# Patient Record
Sex: Male | Born: 1975 | Race: White | Hispanic: No | Marital: Married | Smoking: Never smoker
Health system: Southern US, Community
[De-identification: ages and names within clinical notes are randomized; demographics above are authoritative.]

## PROBLEM LIST (undated history)

## (undated) DIAGNOSIS — S76119A Strain of unspecified quadriceps muscle, fascia and tendon, initial encounter: Secondary | ICD-10-CM

## (undated) DIAGNOSIS — N4400 Torsion of testis, unspecified: Secondary | ICD-10-CM

## (undated) DIAGNOSIS — R7989 Other specified abnormal findings of blood chemistry: Secondary | ICD-10-CM

## (undated) DIAGNOSIS — G43909 Migraine, unspecified, not intractable, without status migrainosus: Secondary | ICD-10-CM

## (undated) DIAGNOSIS — D569 Thalassemia, unspecified: Secondary | ICD-10-CM

## (undated) HISTORY — DX: Migraine, unspecified, not intractable, without status migrainosus: G43.909

## (undated) HISTORY — DX: Strain of unspecified quadriceps muscle, fascia and tendon, initial encounter: S76.119A

## (undated) HISTORY — DX: Thalassemia, unspecified: D56.9

## (undated) HISTORY — DX: Other specified abnormal findings of blood chemistry: R79.89

## (undated) HISTORY — PX: OTHER SURGICAL HISTORY: SHX169

## (undated) HISTORY — DX: Torsion of testis, unspecified: N44.00

## (undated) HISTORY — PX: ORCHIECTOMY: SHX2116

---

## 2003-06-08 ENCOUNTER — Encounter: Admission: RE | Admit: 2003-06-08 | Discharge: 2003-06-08 | Payer: Self-pay | Admitting: Internal Medicine

## 2003-08-17 ENCOUNTER — Encounter: Payer: Self-pay | Admitting: Internal Medicine

## 2004-04-05 ENCOUNTER — Ambulatory Visit: Payer: Self-pay | Admitting: Internal Medicine

## 2004-04-19 ENCOUNTER — Ambulatory Visit: Payer: Self-pay | Admitting: Internal Medicine

## 2004-07-14 ENCOUNTER — Ambulatory Visit: Payer: Self-pay | Admitting: Internal Medicine

## 2004-07-21 ENCOUNTER — Ambulatory Visit: Payer: Self-pay | Admitting: Internal Medicine

## 2004-07-28 ENCOUNTER — Ambulatory Visit: Payer: Self-pay | Admitting: Internal Medicine

## 2005-02-17 ENCOUNTER — Ambulatory Visit: Payer: Self-pay | Admitting: Internal Medicine

## 2005-05-10 ENCOUNTER — Ambulatory Visit: Payer: Self-pay | Admitting: Internal Medicine

## 2005-05-16 ENCOUNTER — Ambulatory Visit: Payer: Self-pay | Admitting: Internal Medicine

## 2006-10-29 ENCOUNTER — Ambulatory Visit: Payer: Self-pay | Admitting: Internal Medicine

## 2006-10-29 LAB — CONVERTED CEMR LAB
Alkaline Phosphatase: 59 units/L (ref 39–117)
BUN: 16 mg/dL (ref 6–23)
Basophils Relative: 0.2 % (ref 0.0–1.0)
CO2: 31 meq/L (ref 19–32)
Creatinine, Ser: 1.1 mg/dL (ref 0.4–1.5)
Eosinophils Relative: 1.7 % (ref 0.0–5.0)
HCT: 38.2 % — ABNORMAL LOW (ref 39.0–52.0)
Hemoglobin: 12.2 g/dL — ABNORMAL LOW (ref 13.0–17.0)
LDL Cholesterol: 86 mg/dL (ref 0–99)
Monocytes Absolute: 0.4 10*3/uL (ref 0.2–0.7)
Monocytes Relative: 7.6 % (ref 3.0–11.0)
Neutrophils Relative %: 65.1 % (ref 43.0–77.0)
Potassium: 4 meq/L (ref 3.5–5.1)
RDW: 15.2 % — ABNORMAL HIGH (ref 11.5–14.6)
Total Bilirubin: 1.5 mg/dL — ABNORMAL HIGH (ref 0.3–1.2)
Total Protein: 6.6 g/dL (ref 6.0–8.3)
VLDL: 14 mg/dL (ref 0–40)
WBC: 4.8 10*3/uL (ref 4.5–10.5)

## 2006-11-05 ENCOUNTER — Ambulatory Visit: Payer: Self-pay | Admitting: Internal Medicine

## 2006-11-05 DIAGNOSIS — D568 Other thalassemias: Secondary | ICD-10-CM | POA: Insufficient documentation

## 2006-11-05 LAB — CONVERTED CEMR LAB: Iron: 105 ug/dL (ref 42–165)

## 2007-04-01 ENCOUNTER — Ambulatory Visit: Payer: Self-pay | Admitting: Internal Medicine

## 2007-04-01 LAB — CONVERTED CEMR LAB
Basophils Absolute: 0 10*3/uL (ref 0.0–0.1)
Eosinophils Absolute: 0.1 10*3/uL (ref 0.0–0.6)
Hemoglobin: 12.8 g/dL — ABNORMAL LOW (ref 13.0–17.0)
Iron: 113 ug/dL (ref 42–165)
Lymphocytes Relative: 21.5 % (ref 12.0–46.0)
MCHC: 32.5 g/dL (ref 30.0–36.0)
Monocytes Absolute: 0.4 10*3/uL (ref 0.2–0.7)
Monocytes Relative: 7.1 % (ref 3.0–11.0)
Neutro Abs: 4.3 10*3/uL (ref 1.4–7.7)
RDW: 15.1 % — ABNORMAL HIGH (ref 11.5–14.6)

## 2007-09-23 ENCOUNTER — Encounter: Payer: Self-pay | Admitting: Internal Medicine

## 2007-09-23 ENCOUNTER — Ambulatory Visit: Payer: Self-pay | Admitting: Internal Medicine

## 2007-09-23 DIAGNOSIS — C44599 Other specified malignant neoplasm of skin of other part of trunk: Secondary | ICD-10-CM | POA: Insufficient documentation

## 2007-09-23 DIAGNOSIS — L568 Other specified acute skin changes due to ultraviolet radiation: Secondary | ICD-10-CM | POA: Insufficient documentation

## 2007-10-10 ENCOUNTER — Ambulatory Visit: Payer: Self-pay | Admitting: Internal Medicine

## 2007-10-10 ENCOUNTER — Encounter: Payer: Self-pay | Admitting: Internal Medicine

## 2007-10-16 ENCOUNTER — Encounter: Payer: Self-pay | Admitting: Internal Medicine

## 2007-10-22 ENCOUNTER — Encounter: Payer: Self-pay | Admitting: Internal Medicine

## 2007-12-09 ENCOUNTER — Ambulatory Visit: Payer: Self-pay | Admitting: Internal Medicine

## 2007-12-09 LAB — CONVERTED CEMR LAB
ALT: 30 units/L (ref 0–53)
Albumin: 4.1 g/dL (ref 3.5–5.2)
Alkaline Phosphatase: 47 units/L (ref 39–117)
BUN: 15 mg/dL (ref 6–23)
CO2: 32 meq/L (ref 19–32)
Eosinophils Relative: 2.4 % (ref 0.0–5.0)
GFR calc Af Amer: 110 mL/min
Glucose, Bld: 90 mg/dL (ref 70–99)
HCT: 37.3 % — ABNORMAL LOW (ref 39.0–52.0)
Hemoglobin: 12 g/dL — ABNORMAL LOW (ref 13.0–17.0)
Lymphocytes Relative: 22.9 % (ref 12.0–46.0)
Monocytes Absolute: 0.3 10*3/uL (ref 0.1–1.0)
Monocytes Relative: 6.5 % (ref 3.0–12.0)
Neutro Abs: 3.6 10*3/uL (ref 1.4–7.7)
Nitrite: NEGATIVE
Platelets: 165 10*3/uL (ref 150–400)
Potassium: 3.9 meq/L (ref 3.5–5.1)
Specific Gravity, Urine: 1.015
Total CHOL/HDL Ratio: 6
Total Protein: 6.4 g/dL (ref 6.0–8.3)
WBC Urine, dipstick: NEGATIVE
WBC: 5.2 10*3/uL (ref 4.5–10.5)

## 2007-12-16 ENCOUNTER — Ambulatory Visit: Payer: Self-pay | Admitting: Internal Medicine

## 2007-12-16 DIAGNOSIS — E782 Mixed hyperlipidemia: Secondary | ICD-10-CM | POA: Insufficient documentation

## 2007-12-20 ENCOUNTER — Ambulatory Visit: Payer: Self-pay | Admitting: Internal Medicine

## 2007-12-25 LAB — CONVERTED CEMR LAB: Mumps IgG: 1.25 — ABNORMAL HIGH

## 2008-07-22 ENCOUNTER — Telehealth: Payer: Self-pay | Admitting: Internal Medicine

## 2009-04-28 ENCOUNTER — Ambulatory Visit: Payer: Self-pay | Admitting: Internal Medicine

## 2009-04-28 DIAGNOSIS — R1084 Generalized abdominal pain: Secondary | ICD-10-CM | POA: Insufficient documentation

## 2009-04-28 LAB — CONVERTED CEMR LAB
ALT: 35 units/L (ref 0–53)
AST: 25 units/L (ref 0–37)
Albumin: 4.3 g/dL (ref 3.5–5.2)
Alkaline Phosphatase: 69 units/L (ref 39–117)
Basophils Absolute: 0 10*3/uL (ref 0.0–0.1)
Basophils Relative: 0 % (ref 0–1)
Hemoglobin: 13.2 g/dL (ref 13.0–17.0)
Lymphocytes Relative: 11 % — ABNORMAL LOW (ref 12–46)
MCHC: 31.7 g/dL (ref 30.0–36.0)
Neutro Abs: 6.4 10*3/uL (ref 1.7–7.7)
Neutrophils Relative %: 82 % — ABNORMAL HIGH (ref 43–77)
Platelets: 231 10*3/uL (ref 150–400)
RDW: 15.6 % — ABNORMAL HIGH (ref 11.5–15.5)
Total Protein: 6.9 g/dL (ref 6.0–8.3)

## 2009-04-30 ENCOUNTER — Telehealth: Payer: Self-pay | Admitting: Internal Medicine

## 2009-07-05 ENCOUNTER — Telehealth (INDEPENDENT_AMBULATORY_CARE_PROVIDER_SITE_OTHER): Payer: Self-pay | Admitting: *Deleted

## 2010-05-17 NOTE — Progress Notes (Signed)
Summary: needs to speak with Jose Lutz  Phone Note Call from Patient Call back at Home Phone [phone]   Caller: Patient-live call Reason for Call: Acute Illness Summary of Call: Was told to call back and speak with Jose Lutz. Needs to give her an update. He is fine. please return call. Initial call taken by: Warnell Forester,  April 30, 2009 12:57 PM  Follow-up for Phone Call        left message on machine call back if problems Follow-up by: Willy Eddy, LPN,  April 30, 2009 1:40 PM

## 2010-05-17 NOTE — Assessment & Plan Note (Signed)
Summary: sore throat/bmw   Vital Signs:  Patient profile:   35 year old male Height:      73 inches Weight:      180 pounds BMI:     23.83 Temp:     98.2 degrees F oral Pulse rate:   72 / minute Resp:     14 per minute BP sitting:   110 / 70  (left arm)  Vitals Entered By: Willy Eddy, LPN (April 28, 2009 12:19 PM) CC: had sinus infection about 1 and 1/2 weeks ago and took avelox for 3-4 days and started having explosvie diarrhea and n&v for 24 hours-stopped avelox and abd quietend down then started back in several days with abd pain and diarrhea   CC:  had sinus infection about 1 and 1/2 weeks ago and took avelox for 3-4 days and started having explosvie diarrhea and n&v for 24 hours-stopped avelox and abd quietend down then started back in several days with abd pain and diarrhea.  History of Present Illness: Took avelox for 3 days then had diarrhea with vomiting... ( 04/19/2009) after 24 hours he has week but the symptoms resolved. Then four days ago the loose stools and abdominal pain resumed ( has bee taking imodium) stools are watery brown in color  Pain is mid epigastric pt has between 3-4 stools a day has not been on other ABx that the avelox no recent dental work no family hx of colitis no hx of ulcer  Preventive Screening-Counseling & Management  Alcohol-Tobacco     Alcohol drinks/day: <1     Smoking Status: never     Passive Smoke Exposure: no  Allergies: No Known Drug Allergies  Past History:  Family History: Last updated: 10/10/2006 Family History of Arthritis Family Hsitory Headaches Family History Other cancer-Prostate  Social History: Last updated: 12/16/2007 Married Never Smoked Alcohol use-yes Occupation: Airline pilot Regular exercise-yes  Risk Factors: Alcohol Use: <1 (04/28/2009) Caffeine Use: 0 (11/05/2006) Exercise: yes (12/16/2007)  Risk Factors: Smoking Status: never (04/28/2009) Passive Smoke Exposure: no (04/28/2009)  Past  medical, surgical, family and social histories (including risk factors) reviewed, and no changes noted (except as noted below).  Past Medical History: Reviewed history from 12/16/2007 and no changes required. Migraines hereditary thalasemia trait tore quad tendn in June 2009  Past Surgical History: Reviewed history from 11/05/2006 and no changes required. testicular torsion Orchiectomy  Family History: Reviewed history from 10/10/2006 and no changes required. Family History of Arthritis Family Hsitory Headaches Family History Other cancer-Prostate  Social History: Reviewed history from 12/16/2007 and no changes required. Married Never Smoked Alcohol use-yes Occupation: Airline pilot Regular exercise-yes  Review of Systems  The patient denies anorexia, fever, weight loss, weight gain, vision loss, decreased hearing, hoarseness, chest pain, syncope, dyspnea on exertion, peripheral edema, prolonged cough, headaches, hemoptysis, abdominal pain, melena, hematochezia, severe indigestion/heartburn, hematuria, incontinence, genital sores, muscle weakness, suspicious skin lesions, transient blindness, difficulty walking, depression, unusual weight change, abnormal bleeding, enlarged lymph nodes, angioedema, and breast masses.    Physical Exam  General:  alert and pale.   Head:  normocephalic and atraumatic.   Eyes:  pupils equal and pupils round.   Ears:  R ear normal and L ear normal.   Nose:  no external deformity and no nasal discharge.   Neck:  No deformities, masses, or tenderness noted. Lungs:  Normal respiratory effort, chest expands symmetrically. Lungs are clear to auscultation, no crackles or wheezes. Heart:  Normal rate and regular rhythm. S1 and S2  normal without gallop, murmur, click, rub or other extra sounds. Abdomen:  no guarding, no rigidity, no rebound tenderness, epigastric tenderness, LUQ tenderness, and LLQ tenderness.     Impression & Recommendations:  Problem # 1:   ABDOMINAL PAIN, GENERALIZED (ICD-789.07) Assessment New  differential included  gal bladder, colitis ( infections and inflamatory ) and less likely but still of concern cdif  Discussed symptom control with the patient.  WILL CHECK cbc AND LFTS. ORDER CIPRO AND FLAGYL WITH CLOSE MONITERING AND CONSDERATON FOR COLONOSCOPY IF SYMPTOMS PERSIST  Orders: Venipuncture (09811) TLB-CBC Platelet - w/Differential (85025-CBCD) TLB-Hepatic/Liver Function Pnl (80076-HEPATIC)  Complete Medication List: 1)  Clarinex 5 Mg Tabs (Desloratadine) .... As needed 2)  Excedrin Migraine 250-250-65 Mg Tabs (Aspirin-acetaminophen-caffeine) .... Take prn 3)  Cipro 500 Mg Tabs (Ciprofloxacin hcl) .... One by mouth two times a day 4)  Flagyl 500 Mg Tabs (Metronidazole) .... One by mouth tid  Patient Instructions: 1)  Take your antibiotic as prescribed until ALL of it is gone, but stop if you develop a rash or swelling and contact our office as soon as possible.  cALL FRIDAY FOR STATIS UPDATE Prescriptions: FLAGYL 500 MG TABS (METRONIDAZOLE) ONE by mouth tid  #30 x 0   Entered and Authorized by:   Stacie Glaze MD   Signed by:   Stacie Glaze MD on 04/28/2009   Method used:   Print then Give to Patient   RxID:   [id number] CIPRO 500 MG TABS (CIPROFLOXACIN HCL) one by mouth two times a day  #20 x 0   Entered and Authorized by:   Stacie Glaze MD   Signed by:   Stacie Glaze MD on 04/28/2009   Method used:   Print then Give to Patient   RxID:   [id number]

## 2010-05-17 NOTE — Progress Notes (Signed)
Summary: REQ FOR REFILL  Phone Note Call from Patient   Caller: Patient Summary of Call: Pt called to req a Rx for med: Clarinex 5 Mg Tabs sent to [facility] in [place].  Initial call taken by: Debbra Riding,  July 05, 2009 11:01 AM    Prescriptions: CLARINEX 5 MG  TABS (DESLORATADINE) as needed  #30 x 6   Entered by:   Willy Eddy, LPN   Authorized by:   Stacie Glaze MD   Signed by:   Willy Eddy, LPN on 54/27/0623   Method used:   Electronically to        [facility] [address] [phone]* (retail)       [address]       [place], [place]  [address]       Ph: [phone]       Fax: [phone]   RxID:   [id number]

## 2010-11-28 ENCOUNTER — Ambulatory Visit (INDEPENDENT_AMBULATORY_CARE_PROVIDER_SITE_OTHER): Payer: BC Managed Care – PPO | Admitting: Internal Medicine

## 2010-11-28 ENCOUNTER — Encounter: Payer: Self-pay | Admitting: Internal Medicine

## 2010-11-28 VITALS — BP 110/70 | HR 68 | Temp 98.2°F | Resp 14 | Ht 73.0 in | Wt 178.0 lb

## 2010-11-28 DIAGNOSIS — Z23 Encounter for immunization: Secondary | ICD-10-CM

## 2010-11-28 DIAGNOSIS — D563 Thalassemia minor: Secondary | ICD-10-CM

## 2010-11-28 DIAGNOSIS — Z0000 Encounter for general adult medical examination without abnormal findings: Secondary | ICD-10-CM

## 2010-11-28 NOTE — Progress Notes (Signed)
  Subjective:    Patient ID: Jose Lutz, male    DOB: 1975/11/16, 35 y.o.   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  Has a history of thalassemia doing well blood pressure vital signs are all stable exercise regular  Review of Systems  Constitutional: Negative for fever and fatigue.  HENT: Negative for hearing loss, congestion, neck pain and postnasal drip.   Eyes: Negative for discharge, redness and visual disturbance.  Respiratory: Negative for cough, shortness of breath and wheezing.   Cardiovascular: Negative for leg swelling.  Gastrointestinal: Negative for abdominal pain, constipation and abdominal distention.  Genitourinary: Negative for urgency and frequency.  Musculoskeletal: Negative for joint swelling and arthralgias.  Skin: Negative for color change and rash.  Neurological: Negative for weakness and light-headedness.  Hematological: Negative for adenopathy.  Psychiatric/Behavioral: Negative for behavioral problems.   Past Medical History  Diagnosis Date  . Migraines   . Quadriceps tendon rupture    Past Surgical History  Procedure Date  . Testicular torsion   . Orchiectomy     reports that he has never smoked. He does not have any smokeless tobacco history on file. He reports that he does not drink alcohol or use illicit drugs. family history includes Arthritis in his other; Cancer in his other; and Migraines in his other. Allergies not on file     Objective:   Physical Exam  Nursing note and vitals reviewed. Constitutional: He appears well-developed and well-nourished.  HENT:  Head: Normocephalic and atraumatic.  Eyes: Conjunctivae are normal. Pupils are equal, round, and reactive to light.  Neck: Normal range of motion. Neck supple.    Cardiovascular: Normal rate and regular rhythm.   Pulmonary/Chest: Effort normal and breath sounds normal.  Abdominal: Soft. Bowel sounds are normal.          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

## 2010-11-29 LAB — VARICELLA ZOSTER ANTIBODY, IGG: Varicella IgG: 3.69 {ISR} — ABNORMAL HIGH

## 2010-11-29 LAB — RUBELLA SCREEN: Rubella: 49.9 IU/mL — ABNORMAL HIGH

## 2010-12-29 ENCOUNTER — Ambulatory Visit: Payer: BC Managed Care – PPO | Admitting: Internal Medicine

## 2010-12-30 ENCOUNTER — Ambulatory Visit (INDEPENDENT_AMBULATORY_CARE_PROVIDER_SITE_OTHER): Payer: BC Managed Care – PPO | Admitting: *Deleted

## 2010-12-30 DIAGNOSIS — Z23 Encounter for immunization: Secondary | ICD-10-CM

## 2011-06-02 ENCOUNTER — Ambulatory Visit: Payer: BC Managed Care – PPO | Admitting: Internal Medicine

## 2011-06-07 ENCOUNTER — Ambulatory Visit (INDEPENDENT_AMBULATORY_CARE_PROVIDER_SITE_OTHER): Payer: BC Managed Care – PPO | Admitting: Internal Medicine

## 2011-06-07 DIAGNOSIS — Z0000 Encounter for general adult medical examination without abnormal findings: Secondary | ICD-10-CM

## 2011-06-07 DIAGNOSIS — Z23 Encounter for immunization: Secondary | ICD-10-CM

## 2011-08-22 ENCOUNTER — Telehealth: Payer: Self-pay | Admitting: *Deleted

## 2011-08-22 NOTE — Telephone Encounter (Signed)
Pt wants Bonnye to call him re: his son's diagnosis, please.

## 2011-08-23 NOTE — Telephone Encounter (Signed)
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

## 2011-11-03 ENCOUNTER — Telehealth: Payer: Self-pay | Admitting: Internal Medicine

## 2011-11-03 ENCOUNTER — Ambulatory Visit (INDEPENDENT_AMBULATORY_CARE_PROVIDER_SITE_OTHER): Payer: BC Managed Care – PPO | Admitting: Internal Medicine

## 2011-11-03 ENCOUNTER — Encounter: Payer: Self-pay | Admitting: Internal Medicine

## 2011-11-03 VITALS — BP 100/70 | HR 72 | Temp 98.6°F | Resp 16 | Ht 73.0 in | Wt 184.0 lb

## 2011-11-03 DIAGNOSIS — S2232XA Fracture of one rib, left side, initial encounter for closed fracture: Secondary | ICD-10-CM

## 2011-11-03 DIAGNOSIS — S2239XA Fracture of one rib, unspecified side, initial encounter for closed fracture: Secondary | ICD-10-CM

## 2011-11-03 MED ORDER — HYDROCODONE-ACETAMINOPHEN 10-325 MG PO TABS: 1.0000 | ORAL_TABLET | Freq: Three times a day (TID) | ORAL | Status: AC | PRN

## 2011-11-03 NOTE — Progress Notes (Signed)
  Subjective:    Patient ID: Jose Lutz, male    DOB: 1975/07/07, 36 y.o.   MRN: [id number]  HPI Patient had trauma and a sports accident with a need to the chest resulting in a large hematoma over the seventh rib and examination consistent with a seventh rib fracture.  The sixth and eighth ribs appear to be stable he has no lung findings indicating any puncture or hemoperitoneum   Review of Systems  Constitutional: Negative for fever and fatigue.  HENT: Negative for hearing loss, congestion, neck pain and postnasal drip.   Eyes: Negative for discharge, redness and visual disturbance.  Respiratory: Negative for cough, shortness of breath and wheezing.   Cardiovascular: Negative for leg swelling.  Gastrointestinal: Negative for abdominal pain, constipation and abdominal distention.  Genitourinary: Negative for urgency and frequency.  Musculoskeletal: Negative for joint swelling and arthralgias.  Skin: Negative for color change and rash.  Neurological: Negative for weakness and light-headedness.  Hematological: Negative for adenopathy.  Psychiatric/Behavioral: Negative for behavioral problems.       Objective:   Physical Exam  Constitutional: He appears well-developed and well-nourished.  HENT:  Head: Normocephalic and atraumatic.  Eyes: Conjunctivae are normal. Pupils are equal, round, and reactive to light.  Neck: Normal range of motion. Neck supple.  Cardiovascular: Normal rate and regular rhythm.   Pulmonary/Chest: Effort normal and breath sounds normal. No respiratory distress. He has no wheezes. He has no rales. He exhibits tenderness.  Abdominal: Soft. Bowel sounds are normal.          Assessment & Plan:  Rib fracture pain control teach how to do a rib wrap

## 2011-11-03 NOTE — Patient Instructions (Signed)
Probable cracked ribRib Fracture Your caregiver has diagnosed you as having a rib fracture (a break). This can occur by a blow to the chest, by a fall against a hard object, or by violent coughing or sneezing. There may be one or many breaks. Rib fractures may heal on their own within 3 to 8 weeks. The longer healing period is usually associated with a continued cough or other aggravating activities. HOME CARE INSTRUCTIONS   Avoid strenuous activity. Be careful during activities and avoid bumping the injured rib. Activities that cause pain pull on the fracture site(s) and are best avoided if possible.   Eat a normal, well-balanced diet. Drink plenty of fluids to avoid constipation.   Take deep breaths several times a day to keep lungs free of infection. Try to cough several times a day, splinting the injured area with a pillow. This will help prevent pneumonia.   Do not wear a rib belt or binder. These restrict breathing which can lead to pneumonia.   Only take over-the-counter or prescription medicines for pain, discomfort, or fever as directed by your caregiver.  SEEK MEDICAL CARE IF:  You develop a continual cough, associated with thick or bloody sputum. SEEK IMMEDIATE MEDICAL CARE IF:   You have a fever.   You have difficulty breathing.   You have nausea (feeling sick to your stomach), vomiting, or abdominal (belly) pain.   You have worsening pain, not controlled with medications.  Document Released: 04/03/2005 Document Revised: 03/23/2011 Document Reviewed: 09/05/2006 [facility] Patient Information 2012 [place], [place].

## 2011-11-03 NOTE — Telephone Encounter (Signed)
Caller: /Patient; PCP: Darryll Capers; CB#: [phone];  Call regarding Injury/Trauma playing [employer] on 11/02/11; Another player came down on his L upper side with their knee- under arm pit area. He has bruise, area tender and hurts intensely when he tries to lift L arm out in front. Hurts with deep breath, not with regular breathing. Trouble sleeping last night; took Ibuprofen- 2 tabs and helped some. Triage and care advice per Chest Trauma Protocol and advised to be checked within 4 hours. He is out of town at [employer] but will go to [facility] this evening and f/u with the office if advised.

## 2011-11-03 NOTE — Telephone Encounter (Signed)
Ov this pm if he can get from [place] to here by 4- will call if cant make it and go to urgent care

## 2011-11-06 ENCOUNTER — Other Ambulatory Visit: Payer: Self-pay | Admitting: Internal Medicine

## 2011-11-06 ENCOUNTER — Ambulatory Visit (INDEPENDENT_AMBULATORY_CARE_PROVIDER_SITE_OTHER)
Admission: RE | Admit: 2011-11-06 | Discharge: 2011-11-06 | Disposition: A | Discharge status: Home / Self Care | Payer: BC Managed Care – PPO | Source: Ambulatory Visit | Attending: Internal Medicine | Admitting: Internal Medicine

## 2011-11-06 DIAGNOSIS — S2341XA Sprain of ribs, initial encounter: Secondary | ICD-10-CM

## 2011-11-06 DIAGNOSIS — S299XXA Unspecified injury of thorax, initial encounter: Secondary | ICD-10-CM

## 2011-11-14 ENCOUNTER — Telehealth: Payer: Self-pay | Admitting: Internal Medicine

## 2011-11-14 NOTE — Telephone Encounter (Signed)
He can have labs in august and just add to the edit notes about getting other labs( see note on existing lab appointment

## 2011-11-14 NOTE — Telephone Encounter (Signed)
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 Please advise. Patient can be contacted at [phone].

## 2011-11-16 ENCOUNTER — Other Ambulatory Visit (INDEPENDENT_AMBULATORY_CARE_PROVIDER_SITE_OTHER): Payer: BC Managed Care – PPO

## 2011-11-16 DIAGNOSIS — Z0000 Encounter for general adult medical examination without abnormal findings: Secondary | ICD-10-CM

## 2011-11-16 LAB — LIPID PANEL
HDL: 38.5 mg/dL — ABNORMAL LOW (ref >39.00)
Triglycerides: 94 mg/dL (ref 0.0–149.0)
VLDL: 18.8 mg/dL (ref 0.0–40.0)

## 2011-11-16 LAB — POCT URINALYSIS DIPSTICK
Bilirubin, UA: NEGATIVE
Blood, UA: NEGATIVE
Ketones, UA: NEGATIVE
Protein, UA: NEGATIVE
Spec Grav, UA: 1.025
pH, UA: 6

## 2011-11-16 LAB — CBC WITH DIFFERENTIAL/PLATELET
Basophils Absolute: 0 10*3/uL (ref 0.0–0.1)
Eosinophils Absolute: 0.2 10*3/uL (ref 0.0–0.7)
Hemoglobin: 11.9 g/dL — ABNORMAL LOW (ref 13.0–17.0)
Lymphocytes Relative: 24.6 % (ref 12.0–46.0)
Lymphs Abs: 1.1 10*3/uL (ref 0.7–4.0)
MCHC: 31.1 g/dL (ref 30.0–36.0)
Monocytes Relative: 6.2 % (ref 3.0–12.0)
Neutro Abs: 2.9 10*3/uL (ref 1.4–7.7)
Platelets: 161 10*3/uL (ref 150.0–400.0)
RDW: 16.9 % — ABNORMAL HIGH (ref 11.5–14.6)

## 2011-11-16 LAB — BASIC METABOLIC PANEL
BUN: 19 mg/dL (ref 6–23)
CO2: 27 mEq/L (ref 19–32)
Calcium: 9.2 mg/dL (ref 8.4–10.5)
GFR: 88.66 mL/min (ref >60.00)
Glucose, Bld: 87 mg/dL (ref 70–99)

## 2011-11-16 LAB — HEPATIC FUNCTION PANEL
Albumin: 4.3 g/dL (ref 3.5–5.2)
Alkaline Phosphatase: 102 U/L (ref 39–117)
Total Protein: 6.7 g/dL (ref 6.0–8.3)

## 2011-11-17 LAB — HEPATITIS B SURFACE ANTIBODY, QUANTITATIVE: Hepatitis B-Post: 1000 m[IU]/mL

## 2011-11-17 LAB — MUMPS ANTIBODY, IGG: Mumps IgG: 1.89 {ISR} — ABNORMAL HIGH

## 2011-11-21 ENCOUNTER — Encounter: Payer: Self-pay | Admitting: Internal Medicine

## 2011-11-22 ENCOUNTER — Telehealth: Payer: Self-pay | Admitting: *Deleted

## 2011-11-23 NOTE — Telephone Encounter (Signed)
done

## 2011-11-24 ENCOUNTER — Other Ambulatory Visit: Payer: Self-pay | Admitting: *Deleted

## 2011-11-24 MED ORDER — HYDROCODONE-ACETAMINOPHEN 10-325 MG PO TABS: 1.0000 | ORAL_TABLET | Freq: Three times a day (TID) | ORAL | Status: AC | PRN

## 2011-12-08 ENCOUNTER — Ambulatory Visit (INDEPENDENT_AMBULATORY_CARE_PROVIDER_SITE_OTHER): Payer: BC Managed Care – PPO | Admitting: Internal Medicine

## 2011-12-08 DIAGNOSIS — Z0000 Encounter for general adult medical examination without abnormal findings: Secondary | ICD-10-CM

## 2011-12-12 ENCOUNTER — Other Ambulatory Visit: Payer: Self-pay | Admitting: Internal Medicine

## 2011-12-12 LAB — TB SKIN TEST
Induration: 0 mm
TB Skin Test: NEGATIVE

## 2012-01-19 ENCOUNTER — Other Ambulatory Visit: Payer: BC Managed Care – PPO

## 2012-01-26 ENCOUNTER — Encounter: Payer: BC Managed Care – PPO | Admitting: Internal Medicine

## 2012-11-20 ENCOUNTER — Telehealth: Payer: Self-pay | Admitting: Internal Medicine

## 2012-11-20 NOTE — Telephone Encounter (Signed)
Pt is requesting to see Adline Mango for his CPX as he is required to have it completed by the end of this month. Please assist.

## 2012-11-20 NOTE — Telephone Encounter (Signed)
Ok with me 

## 2012-11-22 ENCOUNTER — Ambulatory Visit: Payer: BC Managed Care – PPO | Admitting: Internal Medicine

## 2012-11-28 ENCOUNTER — Other Ambulatory Visit (INDEPENDENT_AMBULATORY_CARE_PROVIDER_SITE_OTHER): Payer: BC Managed Care – PPO

## 2012-11-28 DIAGNOSIS — Z0000 Encounter for general adult medical examination without abnormal findings: Secondary | ICD-10-CM

## 2012-11-28 LAB — BASIC METABOLIC PANEL
Calcium: 9.1 mg/dL (ref 8.4–10.5)
GFR: 86.19 mL/min (ref >60.00)
Glucose, Bld: 87 mg/dL (ref 70–99)
Potassium: 3.6 mEq/L (ref 3.5–5.1)
Sodium: 137 mEq/L (ref 135–145)

## 2012-11-28 LAB — POCT URINALYSIS DIPSTICK
Bilirubin, UA: NEGATIVE
Glucose, UA: NEGATIVE
Leukocytes, UA: NEGATIVE
Nitrite, UA: NEGATIVE

## 2012-11-28 LAB — CBC WITH DIFFERENTIAL/PLATELET
Basophils Absolute: 0 10*3/uL (ref 0.0–0.1)
Eosinophils Relative: 2.3 % (ref 0.0–5.0)
Hemoglobin: 12.6 g/dL — ABNORMAL LOW (ref 13.0–17.0)
Lymphocytes Relative: 22.6 % (ref 12.0–46.0)
Monocytes Relative: 5.7 % (ref 3.0–12.0)
Neutro Abs: 3.7 10*3/uL (ref 1.4–7.7)
RBC: 6.45 Mil/uL — ABNORMAL HIGH (ref 4.22–5.81)
RDW: 16.7 % — ABNORMAL HIGH (ref 11.5–14.6)
WBC: 5.4 10*3/uL (ref 4.5–10.5)

## 2012-11-28 LAB — HEPATIC FUNCTION PANEL
AST: 21 U/L (ref 0–37)
Bilirubin, Direct: 0.1 mg/dL (ref 0.0–0.3)
Total Bilirubin: 1 mg/dL (ref 0.3–1.2)

## 2012-11-28 LAB — TSH: TSH: 2.81 u[IU]/mL (ref 0.35–5.50)

## 2012-11-28 LAB — LIPID PANEL
HDL: 33.7 mg/dL — ABNORMAL LOW (ref >39.00)
Triglycerides: 89 mg/dL (ref 0.0–149.0)

## 2012-11-29 LAB — VARICELLA ZOSTER ANTIBODY, IGG: Varicella IgG: 713.3 Index — ABNORMAL HIGH (ref <135.00)

## 2012-11-29 LAB — VARICELLA ZOSTER ANTIBODY, IGM: Varicella Zoster Ab IgM: 0.09 {ISR} (ref <0.91)

## 2012-11-29 LAB — HEPATITIS B SURFACE ANTIBODY,QUALITATIVE: Hep B S Ab: POSITIVE — AB

## 2012-12-03 ENCOUNTER — Encounter: Payer: Self-pay | Admitting: Family

## 2012-12-03 ENCOUNTER — Ambulatory Visit (INDEPENDENT_AMBULATORY_CARE_PROVIDER_SITE_OTHER): Payer: BC Managed Care – PPO | Admitting: Family

## 2012-12-03 VITALS — BP 108/64 | HR 91 | Ht 72.0 in | Wt 190.0 lb

## 2012-12-03 DIAGNOSIS — Z23 Encounter for immunization: Secondary | ICD-10-CM

## 2012-12-03 DIAGNOSIS — Z111 Encounter for screening for respiratory tuberculosis: Secondary | ICD-10-CM

## 2012-12-03 DIAGNOSIS — Z0000 Encounter for general adult medical examination without abnormal findings: Secondary | ICD-10-CM

## 2012-12-03 DIAGNOSIS — E7800 Pure hypercholesterolemia, unspecified: Secondary | ICD-10-CM

## 2012-12-03 NOTE — Progress Notes (Signed)
Subjective:    Patient ID: Jose Lutz, male    DOB: 1976-04-02, 37 y.o.   MRN: [id number]  HPI   37 year old white male, nonsmoker, patient of Dr. Lovell Sheehan presents for complete physical exam. Denies any concerns today. He has a history of thalassemia, hyperlipidemia better but stable.  Review of Systems  Constitutional: Negative.   HENT: Negative.   Eyes: Negative.   Respiratory: Negative.   Cardiovascular: Negative.   Gastrointestinal: Negative.   Endocrine: Negative.   Genitourinary: Negative.   Musculoskeletal: Negative.   Skin: Negative.   Allergic/Immunologic: Negative.   Neurological: Negative.   Hematological: Negative.   Psychiatric/Behavioral: Negative.    Past Medical History  Diagnosis Date  . Migraines   . Quadriceps tendon rupture     History   Social History  . Marital Status: Married    Spouse Name: N/A    Number of Children: N/A  . Years of Education: N/A   Occupational History  . Not on file.   Social History Main Topics  . Smoking status: Never Smoker   . Smokeless tobacco: Not on file  . Alcohol Use: No  . Drug Use: No  . Sexual Activity: Not on file   Other Topics Concern  . Not on file   Social History Narrative   Married   Occupation: Sales   Regular exercise:  Yes     Past Surgical History  Procedure Laterality Date  . Testicular torsion    . Orchiectomy      Family History  Problem Relation Age of Onset  . Arthritis Other   . Migraines Other   . Cancer Other     prostate     No Known Allergies  Current Outpatient Prescriptions on File Prior to Visit  Medication Sig Dispense Refill  . aspirin-acetaminophen-caffeine (EXCEDRIN MIGRAINE) 250-250-65 MG per tablet Take 1 tablet by mouth every 6 (six) hours as needed.        . desloratadine (CLARINEX) 5 MG tablet Take 5 mg by mouth daily.        Marland Kitchen ibuprofen (ADVIL,MOTRIN) 200 MG tablet Take 200 mg by mouth every 6 (six) hours as needed.       No current  facility-administered medications on file prior to visit.    BP 108/64  Pulse 91  Ht 6' (1.829 m)  Wt 190 lb (86.183 kg)  BMI 25.76 kg/m2chart    Objective:   Physical Exam  Constitutional: He is oriented to person, place, and time. He appears well-developed and well-nourished.  HENT:  Right Ear: External ear normal.  Left Ear: External ear normal.  Nose: Nose normal.  Mouth/Throat: Oropharynx is clear and moist.  Eyes: Conjunctivae and EOM are normal. Pupils are equal, round, and reactive to light.  Neck: Normal range of motion. Neck supple. No thyromegaly present.  Cardiovascular: Normal rate, regular rhythm and normal heart sounds.   Pulmonary/Chest: Effort normal and breath sounds normal.  Abdominal: Soft. Bowel sounds are normal. He exhibits no distension. There is no tenderness. There is no rebound.  Genitourinary: Penis normal.  1 testicle but normal  Musculoskeletal: Normal range of motion. He exhibits no edema and no tenderness.  Neurological: He is alert and oriented to person, place, and time. He has normal reflexes. He displays normal reflexes. No cranial nerve deficit. Coordination normal.  Skin: Skin is warm and dry. No rash noted. No erythema.  Psychiatric: He has a normal mood and affect.      Tdap IM x  1    Assessment & Plan:   Assessment: 1. Complete physical exam 2. Hypercholesterolemia  Plan: Encourage healthy diet and exercise. Followup in one year and sooner as needed.

## 2012-12-03 NOTE — Patient Instructions (Addendum)
Exercise to Stay Healthy Exercise helps you become and stay healthy. EXERCISE IDEAS AND TIPS Choose exercises that:  You enjoy.  Fit into your day. You do not need to exercise really hard to be healthy. You can do exercises at a slow or medium level and stay healthy. You can:  Stretch before and after working out.  Try yoga, Pilates, or tai chi.  Lift weights.  Walk fast, swim, jog, run, climb stairs, bicycle, dance, or rollerskate.  Take aerobic classes. Exercises that burn about 150 calories:  Running 1  miles in 15 minutes.  Playing volleyball for 45 to 60 minutes.  Washing and waxing a car for 45 to 60 minutes.  Playing touch football for 45 minutes.  Walking 1  miles in 35 minutes.  Pushing a stroller 1  miles in 30 minutes.  Playing basketball for 30 minutes.  Raking leaves for 30 minutes.  Bicycling 5 miles in 30 minutes.  Walking 2 miles in 30 minutes.  Dancing for 30 minutes.  Shoveling snow for 15 minutes.  Swimming laps for 20 minutes.  Walking up stairs for 15 minutes.  Bicycling 4 miles in 15 minutes.  Gardening for 30 to 45 minutes.  Jumping rope for 15 minutes.  Washing windows or floors for 45 to 60 minutes. Document Released: 05/06/2010 Document Revised: 06/26/2011 Document Reviewed: 05/06/2010 [employer] Patient Information 2014 [employer].  

## 2012-12-06 LAB — TB SKIN TEST
Induration: 0 mm
TB Skin Test: NEGATIVE

## 2013-02-15 ENCOUNTER — Encounter: Payer: Self-pay | Admitting: Emergency Medicine

## 2013-02-15 ENCOUNTER — Emergency Department
Admission: EM | Admit: 2013-02-15 | Discharge: 2013-02-15 | Disposition: A | Discharge status: Home / Self Care | Payer: BC Managed Care – PPO | Source: Home / Self Care | Attending: Family Medicine | Admitting: Family Medicine

## 2013-02-15 DIAGNOSIS — L03119 Cellulitis of unspecified part of limb: Secondary | ICD-10-CM

## 2013-02-15 DIAGNOSIS — IMO0002 Reserved for concepts with insufficient information to code with codable children: Secondary | ICD-10-CM

## 2013-02-15 LAB — POCT CBC W AUTO DIFF (K'VILLE URGENT CARE)

## 2013-02-15 MED ORDER — DOXYCYCLINE HYCLATE 100 MG PO CAPS: 100.0000 mg | ORAL_CAPSULE | Freq: Two times a day (BID) | ORAL | Status: DC

## 2013-02-15 NOTE — ED Provider Notes (Signed)
CSN: [id number]     Arrival date & time 02/15/13  1702 History   First MD Initiated Contact with Patient 02/15/13 1722     Chief Complaint  Patient presents with  . Joint Swelling    right elbow      HPI Comments: Patient states that he pulled a small hair from the posterior aspect of his right elbow about 5 days ago.  Since then the skin of his elbow has gradually become tender to palpation, and his elbow feels tight with movement although not restricted in motion.  Today he noted slight redness around his elbow and warmth.  He has had chills but no fever.  He recalls no trauma to the elbow.   The history is provided by the patient.    Past Medical History  Diagnosis Date  . Migraines   . Quadriceps tendon rupture    Past Surgical History  Procedure Laterality Date  . Testicular torsion    . Orchiectomy     Family History  Problem Relation Age of Onset  . Arthritis Other   . Migraines Other   . Cancer Other     prostate    History  Substance Use Topics  . Smoking status: Never Smoker   . Smokeless tobacco: Not on file  . Alcohol Use: No    Review of Systems  Constitutional: Positive for chills. Negative for fever, activity change and fatigue.  HENT: Negative.   Eyes: Negative.   Respiratory: Negative.   Cardiovascular: Negative.   Gastrointestinal: Negative.   Genitourinary: Negative.   Musculoskeletal: Positive for joint swelling. Negative for myalgias.  Skin: Positive for color change.  Neurological: Negative for headaches.    Allergies  Review of patient's allergies indicates no known allergies.  Home Medications   Current Outpatient Rx  Name  Route  Sig  Dispense  Refill  . aspirin-acetaminophen-caffeine (EXCEDRIN MIGRAINE) 250-250-65 MG per tablet   Oral   Take 1 tablet by mouth every 6 (six) hours as needed.           . desloratadine (CLARINEX) 5 MG tablet   Oral   Take 5 mg by mouth daily.           Marland Kitchen doxycycline (VIBRAMYCIN) 100 MG  capsule   Oral   Take 1 capsule (100 mg total) by mouth 2 (two) times daily.   20 capsule   0   . ibuprofen (ADVIL,MOTRIN) 200 MG tablet   Oral   Take 200 mg by mouth every 6 (six) hours as needed.          BP 124/85  Pulse 75  Temp(Src) 98 F (36.7 C) (Oral)  Resp 16  Ht 6\' 1"  (1.854 m)  Wt 191 lb (86.637 kg)  BMI 25.2 kg/m2  SpO2 98% Physical Exam Nursing notes and Vital Signs reviewed. Appearance:  Patient appears healthy, stated age, and in no acute distress Eyes:  Pupils are equal, round, and reactive to light and accomodation.  Extraocular movement is intact.  Conjunctivae are not inflamed  Lungs:  Clear to auscultation.  Breath sounds are equal.  Heart:  Regular rate and rhythm without murmurs, rubs, or gallops.   Extremities:  Right elbow is slightly warm to touch with faint erythema present.  The elbow has full range of motion.  There is tenderness over the olecranon bursa. Skin:  No rash present.   ED Course  Procedures  none    Labs Reviewed  POCT CBC W AUTO DIFF (  [facility]) WBC 8.9; LY 20.0; MO 6.0; GR 74.0; Hgb 11.9; Platelets 238          MDM   1. Cellulitis of elbow    Begin doxycycline to cover staph.  Ace wrap applied. Wear ace wrap to minimize swelling.  Apply ice pack 3 or 4 times daily.  Take Ibuprofen 200mg , 4 tabs every 8 hours with food.  Followup with Dr. Rodney Langton if not improving in 4 days, earlier if symptoms worsen.    Lattie Haw, MD 02/17/13 [phone]

## 2013-02-15 NOTE — ED Notes (Signed)
Patient c/o elbow being swollen, tender, sore and warm to touch x 5 days. Patient states it is tender and swelling is now forearm to biceps. Denies injury or trauma to elbow. Patient has tried OTC Excedrin as needed.

## 2013-02-17 ENCOUNTER — Telehealth: Payer: Self-pay | Admitting: Emergency Medicine

## 2013-07-07 ENCOUNTER — Other Ambulatory Visit: Payer: Self-pay | Admitting: Internal Medicine

## 2014-03-12 IMAGING — CR DG RIBS 2V*L*
4 series · 4 of 4 positions shown · non-contrast
Comparison: None.

CLINICAL DATA: History of injury.  History of pain in the rib area.

LEFT RIBS - 2 VIEW

[view not recorded (1 of 4)]
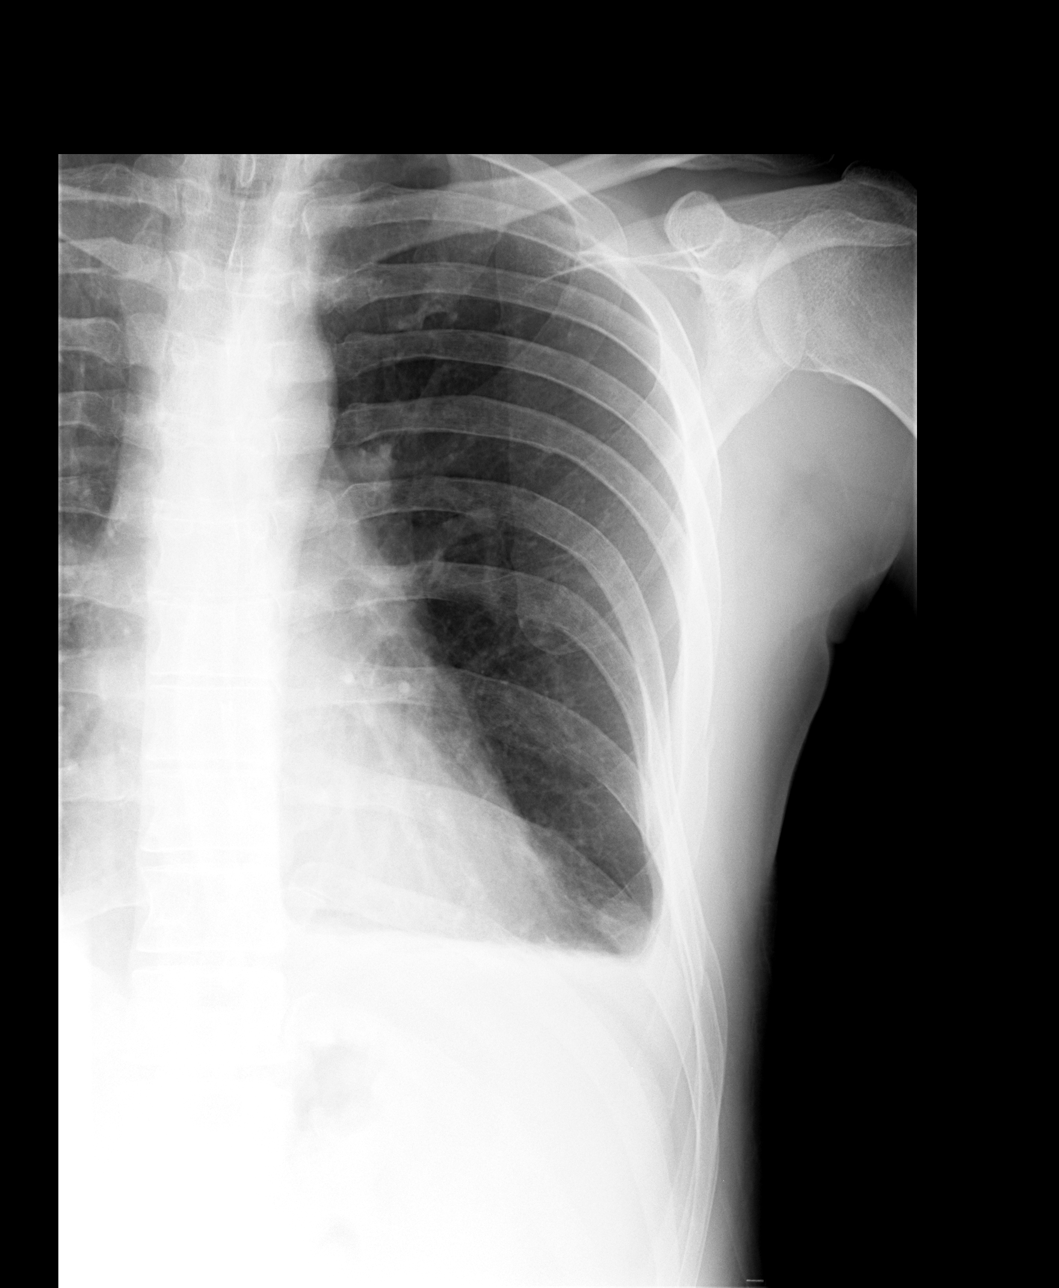

[view not recorded (2 of 4)]
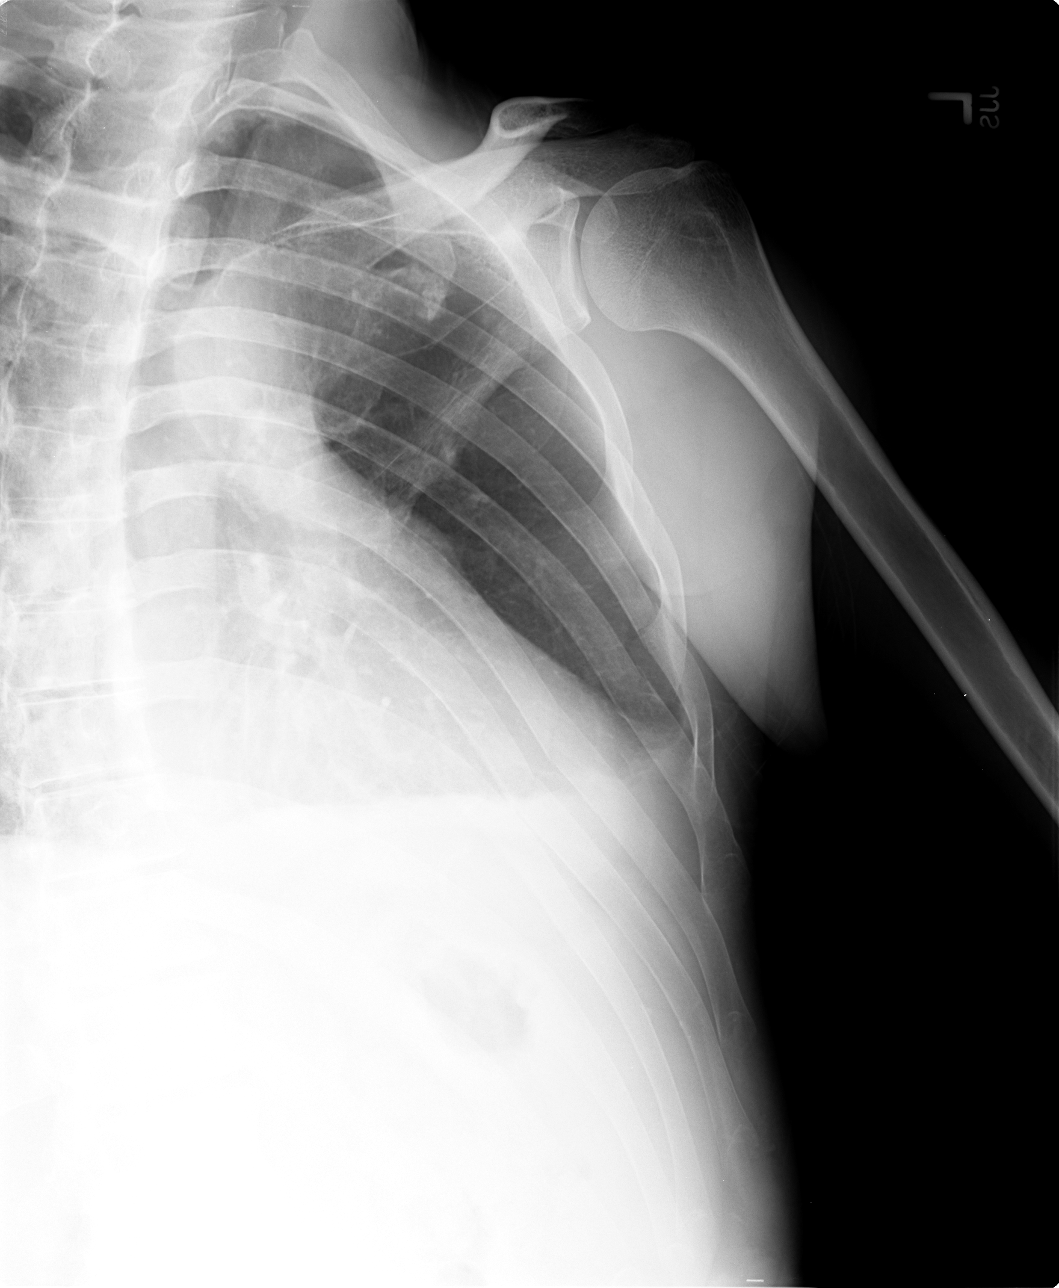

[view not recorded (3 of 4)]
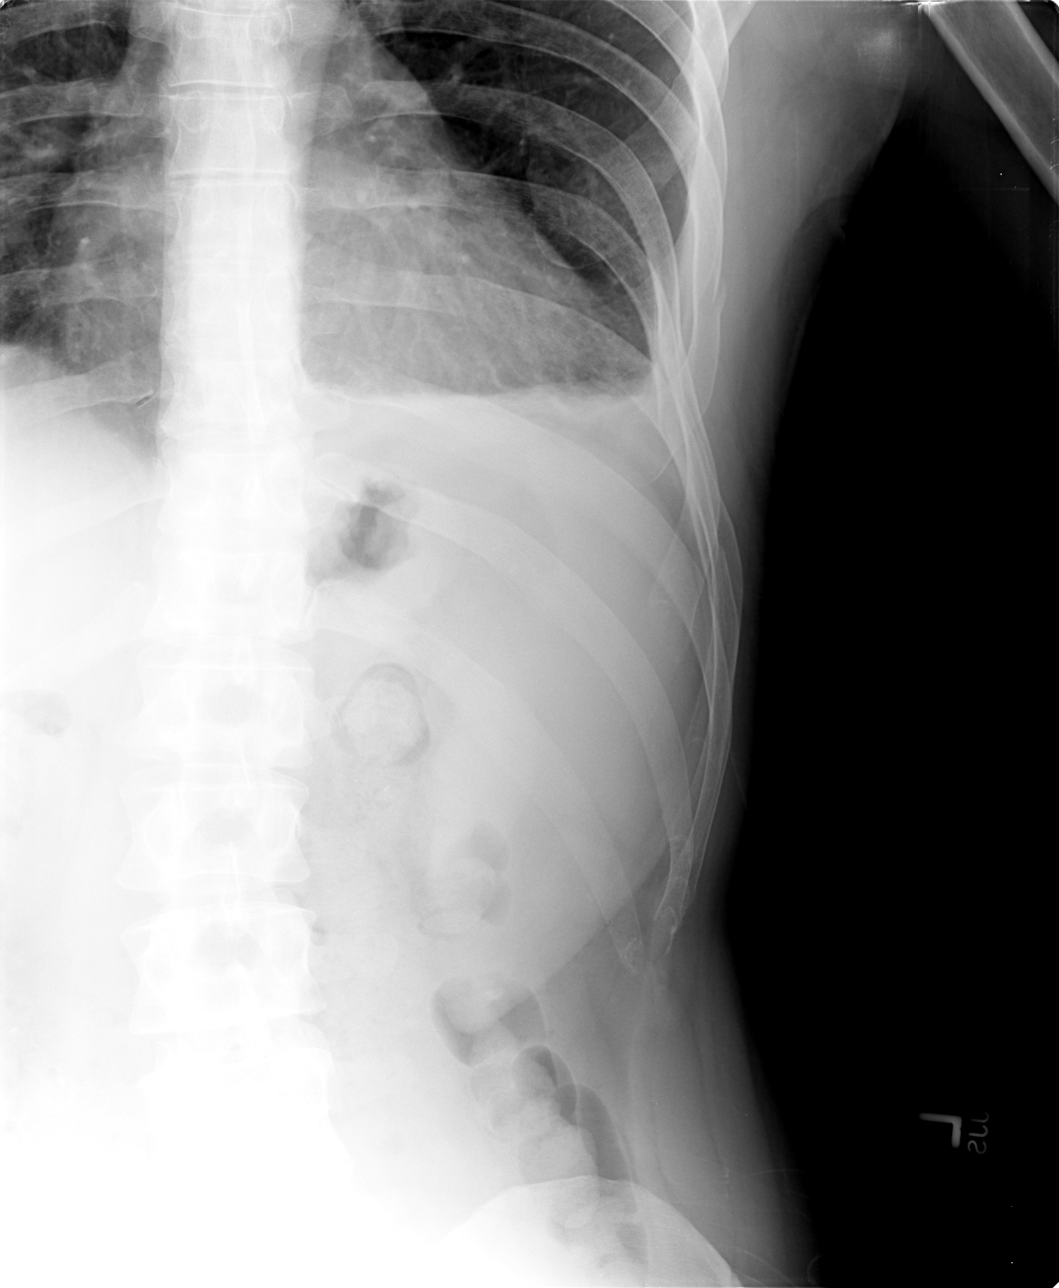

[view not recorded (4 of 4)]
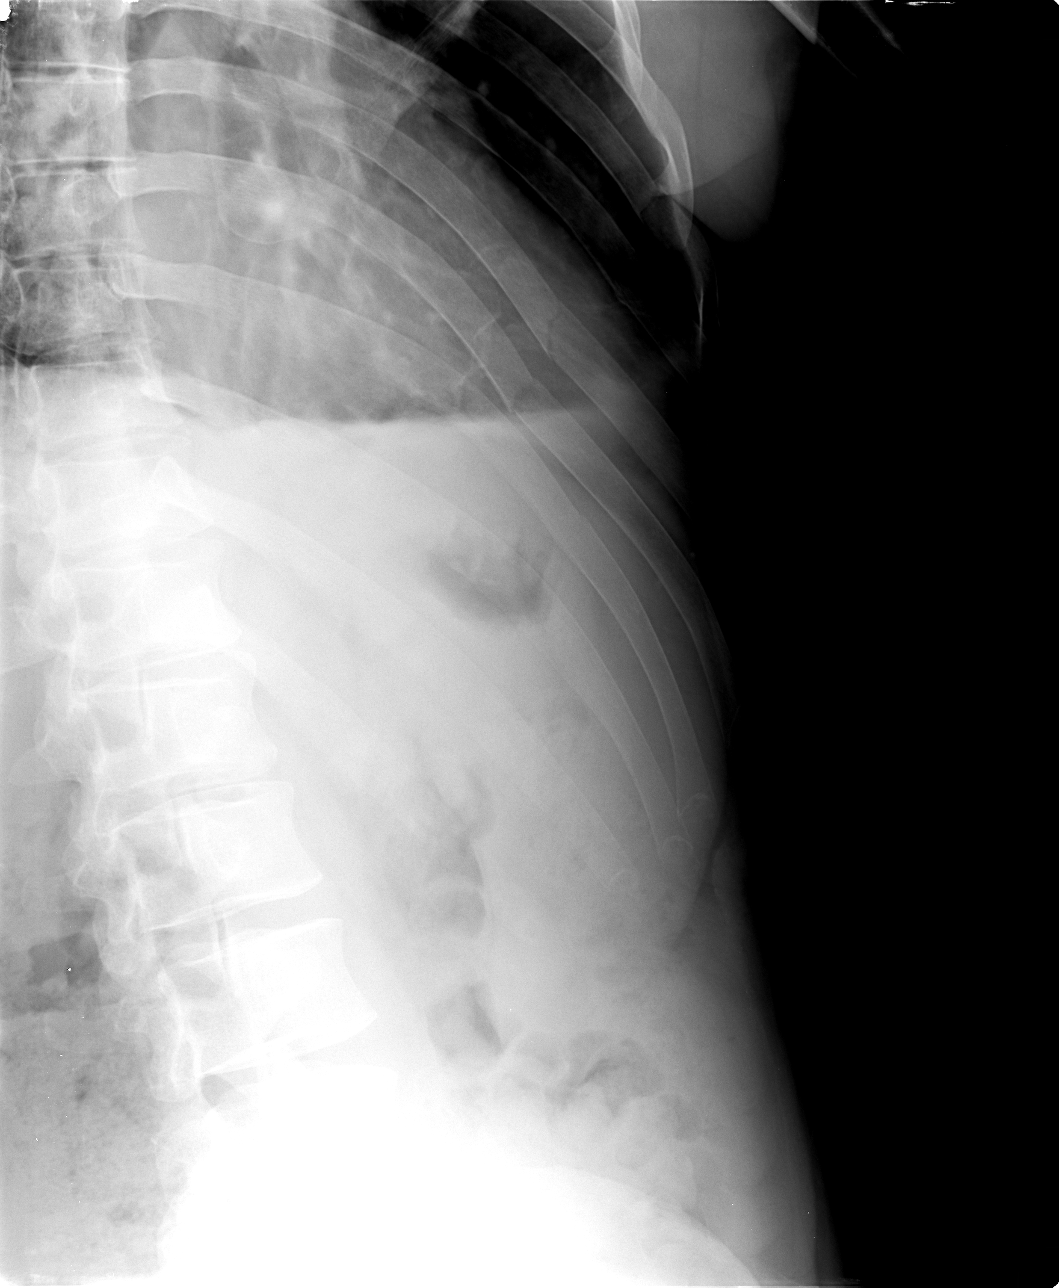

[4 of 4 positions shown; findings below may reference images not displayed]

FINDINGS: There are slightly displaced fractures  of the
anterolateral aspects of the left fifth, sixth, seventh, and eighth
ribs.  No pneumothorax is evident.  There is blunting of the left
costophrenic angle representing pleural thickening or small amount
of pleural fluid.  An element of pneumothorax cannot be excluded.
IMPRESSION: Fractures of left fifth, sixth, seventh, and eighth ribs. No
evidence of pneumothorax. Minimal left costophrenic angle blunting
is seen.  This may reflect pleural thickening versus a small amount
of fluid or blood in the costophrenic angle.

## 2015-02-18 ENCOUNTER — Encounter: Payer: Self-pay | Admitting: Family Medicine

## 2015-02-18 ENCOUNTER — Ambulatory Visit (INDEPENDENT_AMBULATORY_CARE_PROVIDER_SITE_OTHER): Payer: Managed Care, Other (non HMO) | Admitting: Family Medicine

## 2015-02-18 VITALS — BP 119/89 | HR 76 | Ht 73.0 in | Wt 184.0 lb

## 2015-02-18 DIAGNOSIS — E782 Mixed hyperlipidemia: Secondary | ICD-10-CM

## 2015-02-18 DIAGNOSIS — IMO0001 Reserved for inherently not codable concepts without codable children: Secondary | ICD-10-CM

## 2015-02-18 DIAGNOSIS — N529 Male erectile dysfunction, unspecified: Secondary | ICD-10-CM | POA: Insufficient documentation

## 2015-02-18 DIAGNOSIS — Z0000 Encounter for general adult medical examination without abnormal findings: Secondary | ICD-10-CM

## 2015-02-18 DIAGNOSIS — Z23 Encounter for immunization: Secondary | ICD-10-CM

## 2015-02-18 DIAGNOSIS — Z111 Encounter for screening for respiratory tuberculosis: Secondary | ICD-10-CM | POA: Diagnosis not present

## 2015-02-18 MED ORDER — SILDENAFIL CITRATE 20 MG PO TABS: 20.0000 mg | ORAL_TABLET | ORAL | Status: DC | PRN

## 2015-02-18 NOTE — Assessment & Plan Note (Addendum)
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 Encouraged communication and reassurance with his wife as well as will start on sildenafil 20mg  prn. Hormonal deficiency is also possible so will check testosterone.

## 2015-02-18 NOTE — Patient Instructions (Signed)
Thank you for coming in today. Get fasting labs.  Return as needed or in 1 year.  My staff will call you with results.

## 2015-02-18 NOTE — Progress Notes (Signed)
Jose Lutz is a 39 y.o. male who presents to [facility]  today for establishing care, health maintenance proof for work and erectile dysfunction.  Patient needs MMR and Hep B titers for work as he works in [employer]. His only health concern is an episode of inability to maintain erection around one week ago during intercourse with his wife. His wife responded poorly to this, causing stress to the patient. The next day he had an episode of inability to obtain erection. He followed this by avoiding intimate encounters twice since this time. He is able to achieve nocturnal erections per awakening with erection several times in the past week. He is also able to obtain and maintain erection through masturbation. He thinks this is likely due to stress caused by these episodes and his wife's negative response.   Aside from the above problem he has no other complaints and feels well.    Past Medical History  Diagnosis Date  . Migraines   . Quadriceps tendon rupture    Past Surgical History  Procedure Laterality Date  . Testicular torsion    . Orchiectomy     Social History  Substance Use Topics  . Smoking status: Never Smoker   . Smokeless tobacco: Not on file  . Alcohol Use: No   family history includes Arthritis in his other; Cancer in his other; Migraines in his other.  ROS as above Medications: Current Outpatient Prescriptions  Medication Sig Dispense Refill  . aspirin-acetaminophen-caffeine (EXCEDRIN MIGRAINE) 250-250-65 MG tablet Take by mouth every 6 (six) hours as needed for headache.    . Desloratadine (CLARINEX PO) Take by mouth.     No current facility-administered medications for this visit.   No Known Allergies   Exam:  BP 119/89 mmHg  Pulse 76  Ht 6' 1"  (1.854 m)  Wt 184 lb (83.462 kg)  BMI 24.28 kg/m2 Gen: Well NAD HEENT: EOMI,  MMM Lungs: Normal work of breathing. CTABL, no wheeze,  crackles.  Heart: RRR no MRG Abd: NABS, Soft. Nondistended, Nontender Exts: Brisk capillary refill, warm and well perfused.  Psych: Alert and oriented normal speech and thought processes.  No results found for this or any previous visit (from the past 24 hour(s)). No results found.   Please see individual assessment and plan sections.

## 2015-02-18 NOTE — Assessment & Plan Note (Addendum)
Patient is in very great health overall. Will check CBC, CMP, as well as place PPD to be read by urgent care Saturday morning and order MMR and Hep B titers for his work needs.   Obtain lipid panel

## 2015-02-18 NOTE — Assessment & Plan Note (Signed)
Stable. Will check lipid panel. 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 Patient is minimizing his risk factors as well through non-smoker status, balanced diet and frequent exercise. Consulted patient on adding 1-2 OTC omega-3 fatty acid supplementation daily to potentially further minimize his risk. ASCVD risk 1.6% based on old lab results, no further interventions or testing warranted at this time.

## 2015-02-19 LAB — LIPID PANEL
Cholesterol: 125 mg/dL (ref 125–200)
HDL: 38 mg/dL — AB (ref >=40)
LDL Cholesterol: 76 mg/dL (ref <130)
TRIGLYCERIDES: 57 mg/dL (ref <150)
Total CHOL/HDL Ratio: 3.3 Ratio (ref <=5.0)
VLDL: 11 mg/dL (ref <30)

## 2015-02-19 LAB — CBC
HCT: 35.9 % — ABNORMAL LOW (ref 39.0–52.0)
HEMOGLOBIN: 11.7 g/dL — AB (ref 13.0–17.0)
MCH: 19.5 pg — AB (ref 26.0–34.0)
MCHC: 32.6 g/dL (ref 30.0–36.0)
MCV: 59.9 fL — AB (ref 78.0–100.0)
Platelets: 208 10*3/uL (ref 150–400)
RBC: 5.99 MIL/uL — AB (ref 4.22–5.81)
RDW: 16.5 % — ABNORMAL HIGH (ref 11.5–15.5)
WBC: 5.3 10*3/uL (ref 4.0–10.5)

## 2015-02-19 LAB — COMPREHENSIVE METABOLIC PANEL
ALBUMIN: 4.1 g/dL (ref 3.6–5.1)
ALK PHOS: 53 U/L (ref 40–115)
ALT: 19 U/L (ref 9–46)
AST: 18 U/L (ref 10–40)
BILIRUBIN TOTAL: 0.9 mg/dL (ref 0.2–1.2)
BUN: 14 mg/dL (ref 7–25)
CALCIUM: 8.9 mg/dL (ref 8.6–10.3)
CO2: 29 mmol/L (ref 20–31)
Chloride: 104 mmol/L (ref 98–110)
Creat: 0.93 mg/dL (ref 0.60–1.35)
Glucose, Bld: 84 mg/dL (ref 65–99)
Potassium: 4 mmol/L (ref 3.5–5.3)
Sodium: 140 mmol/L (ref 135–146)
Total Protein: 6.3 g/dL (ref 6.1–8.1)

## 2015-02-19 LAB — TESTOSTERONE: Testosterone: 296 ng/dL — ABNORMAL LOW (ref 300–890)

## 2015-02-20 ENCOUNTER — Emergency Department
Admission: EM | Admit: 2015-02-20 | Discharge: 2015-02-20 | Disposition: A | Discharge status: Home / Self Care | Payer: Self-pay | Source: Home / Self Care

## 2015-02-20 LAB — HEPATITIS B SURFACE ANTIBODY, QUANTITATIVE: Hepatitis B-Post: 573 m[IU]/mL

## 2015-02-20 LAB — HIV ANTIBODY (ROUTINE TESTING W REFLEX): HIV: NONREACTIVE

## 2015-02-20 NOTE — ED Notes (Signed)
Pt was seen 11/3 for TB test placement.  He is here today to be read.  Result is Neg.

## 2015-02-22 ENCOUNTER — Other Ambulatory Visit: Payer: Self-pay | Admitting: Family Medicine

## 2015-02-22 DIAGNOSIS — R7989 Other specified abnormal findings of blood chemistry: Secondary | ICD-10-CM

## 2015-02-22 LAB — MEASLES/MUMPS/RUBELLA IMMUNITY
Mumps IgG: 72.3 AU/mL — ABNORMAL HIGH (ref <9.00)
Rubella: 2.38 Index — ABNORMAL HIGH (ref <0.90)
Rubeola IgG: 61.1 AU/mL — ABNORMAL HIGH (ref <25.00)

## 2015-02-22 NOTE — Progress Notes (Signed)
Quick Note:  1) labs for work are normal. 2) testosterone is a bit low. We should recheck this in one month. ______

## 2015-02-23 ENCOUNTER — Telehealth: Payer: Self-pay | Admitting: Family Medicine

## 2015-02-23 DIAGNOSIS — R7989 Other specified abnormal findings of blood chemistry: Secondary | ICD-10-CM

## 2015-02-23 LAB — VARICELLA ZOSTER IGG AND IGM
VARICELLA IGG: 708.1 {index} — AB (ref <135.00)
VARICELLA ZOSTER AB IGM: 0.05 {ISR} (ref <0.91)

## 2015-02-23 NOTE — Telephone Encounter (Signed)
Patient would like to have the labs printed that he will get drawn in about 1 month. Will also add LH and [facility]

## 2015-07-05 ENCOUNTER — Emergency Department
Admission: EM | Admit: 2015-07-05 | Discharge: 2015-07-05 | Disposition: A | Discharge status: Home / Self Care | Payer: Managed Care, Other (non HMO) | Source: Home / Self Care | Attending: Family Medicine | Admitting: Family Medicine

## 2015-07-05 ENCOUNTER — Encounter: Payer: Self-pay | Admitting: *Deleted

## 2015-07-05 DIAGNOSIS — J0100 Acute maxillary sinusitis, unspecified: Secondary | ICD-10-CM | POA: Diagnosis not present

## 2015-07-05 MED ORDER — PREDNISONE 20 MG PO TABS: ORAL_TABLET | ORAL | Status: DC

## 2015-07-05 MED ORDER — AMOXICILLIN-POT CLAVULANATE 875-125 MG PO TABS
1.0000 | ORAL_TABLET | Freq: Two times a day (BID) | ORAL | Status: DC
Start: 2015-07-05 — End: 2015-12-10

## 2015-07-05 NOTE — ED Provider Notes (Signed)
CSN: [id number]     Arrival date & time 07/05/15  1339 History   First MD Initiated Contact with Patient 07/05/15 1406     Chief Complaint  Patient presents with  . Facial Pain  . Sinus Problem   (Consider location/radiation/quality/duration/timing/severity/associated sxs/prior Treatment) HPI  The pt is a 40yo male presenting to [facility] with c/o 2.5 weeks of nasal congestion and mild intermittent productive cough that has turned into sinus pain and pressure with thick yellowed-green mucous the last 3 days.  He also c/o frontal headache, 3/10 at this time.  He has been taking ibuprofen and acetaminophen, which provided temporary relief. Denies fever, chills, n/v/d.     Past Medical History  Diagnosis Date  . Migraines   . Quadriceps tendon rupture    Past Surgical History  Procedure Laterality Date  . Testicular torsion    . Orchiectomy     Family History  Problem Relation Age of Onset  . Arthritis Other   . Migraines Other   . Cancer Other     prostate    Social History  Substance Use Topics  . Smoking status: Never Smoker   . Smokeless tobacco: None  . Alcohol Use: No    Review of Systems  Constitutional: Positive for fatigue. Negative for fever and chills.  HENT: Positive for congestion, rhinorrhea, sinus pressure and voice change ( hoarse voice). Negative for ear pain, sore throat and trouble swallowing.   Respiratory: Positive for cough. Negative for shortness of breath.   Cardiovascular: Negative for chest pain and palpitations.  Gastrointestinal: Negative for nausea, vomiting, abdominal pain and diarrhea.  Musculoskeletal: Positive for myalgias and arthralgias. Negative for back pain.  Skin: Negative for rash.  Neurological: Positive for headaches. Negative for dizziness and light-headedness.    Allergies  Review of patient's allergies indicates no known allergies.  Home Medications   Prior to Admission medications   Medication Sig Start Date End Date Taking?  Authorizing Provider  amoxicillin-clavulanate (AUGMENTIN) 875-125 MG tablet Take 1 tablet by mouth 2 (two) times daily. One po bid x 7 days 07/05/15   Noland Fordyce, PA-C  Desloratadine (CLARINEX PO) Take by mouth.    Historical Provider, MD  predniSONE (DELTASONE) 20 MG tablet 3 tabs po day one, then 2 po daily x 4 days 07/05/15   Noland Fordyce, PA-C  sildenafil (REVATIO) 20 MG tablet Take 1 tablet (20 mg total) by mouth as needed. 02/18/15   Gregor Hams, MD   Meds Ordered and Administered this Visit  Medications - No data to display  BP 113/74 mmHg  Pulse 82  Temp(Src) 98.1 F (36.7 C) (Oral)  Resp 14  Wt 191 lb (86.637 kg)  SpO2 97% No data found.   Physical Exam  Constitutional: He appears well-developed and well-nourished.  HENT:  Head: Normocephalic and atraumatic.  Right Ear: Tympanic membrane normal.  Left Ear: Tympanic membrane normal.  Nose: Mucosal edema and rhinorrhea present. Right sinus exhibits maxillary sinus tenderness. Left sinus exhibits maxillary sinus tenderness.  Mouth/Throat: Uvula is midline, oropharynx is clear and moist and mucous membranes are normal.  Eyes: Conjunctivae are normal. No scleral icterus.  Neck: Normal range of motion. Neck supple.  Cardiovascular: Normal rate, regular rhythm and normal heart sounds.   Pulmonary/Chest: Effort normal and breath sounds normal. No stridor. No respiratory distress. He has no wheezes. He has no rales. He exhibits no tenderness.  Abdominal: Soft. He exhibits no distension. There is no tenderness.  Musculoskeletal: Normal range of  motion.  Lymphadenopathy:    He has no cervical adenopathy.  Neurological: He is alert.  Skin: Skin is warm and dry.  Nursing note and vitals reviewed.   ED Course  Procedures (including critical care time)  Labs Review Labs Reviewed - No data to display  Imaging Review No results found.     MDM   1. Acute maxillary sinusitis, recurrence not specified    Pt c/o 3 days  of facial pain and pressure that started after 2.5 weeks of cold-like symptoms.  Sinus tenderness on exam.  Rx: Augmentin and prednisone.  Advised pt to use acetaminophen and ibuprofen as needed for fever and pain. Encouraged rest and fluids. F/u with PCP in 7-10 days if not improving, sooner if worsening. Pt verbalized understanding and agreement with tx plan.     Noland Fordyce, PA-C 07/05/15 1622

## 2015-07-05 NOTE — Discharge Instructions (Signed)
You may take 400-600mg  Ibuprofen (Motrin) every 6-8 hours for fever and pain  Alternate with Tylenol  You may take 500mg  Tylenol every 4-6 hours as needed for fever and pain  Follow-up with your primary care provider next week for recheck of symptoms if not improving.  Be sure to drink plenty of fluids and rest, at least 8hrs of sleep a night, preferably more while you are sick. Return urgent care or go to closest ER if you cannot keep down fluids/signs of dehydration, fever not reducing with Tylenol, difficulty breathing/wheezing, stiff neck, worsening condition, or other concerns (see below)  Please take antibiotics as prescribed and be sure to complete entire course even if you start to feel better to ensure infection does not come back.   Sinus Rinse WHAT IS A SINUS RINSE? A sinus rinse is a simple home treatment that is used to rinse your sinuses with a sterile mixture of salt and water (saline solution). Sinuses are air-filled spaces in your skull behind the bones of your face and forehead that open into your nasal cavity. You will use the following:  Saline solution.  Neti pot or spray bottle. This releases the saline solution into your nose and through your sinuses. Neti pots and spray bottles can be purchased at [employer], a health food store, or online. WHEN WOULD I DO A SINUS RINSE? A sinus rinse can help to clear mucus, dirt, dust, or pollen from the nasal cavity. You may do a sinus rinse when you have a cold, a virus, nasal allergy symptoms, a sinus infection, or stuffiness in the nose or sinuses. If you are considering a sinus rinse:  Ask your child's health care provider before performing a sinus rinse on your child.  Do not do a sinus rinse if you have had ear or nasal surgery, ear infection, or blocked ears. HOW DO I DO A SINUS RINSE?  Wash your hands.  Disinfect your device according to the directions provided and then dry it.  Use the solution that comes  with your device or one that is sold separately in stores. Follow the mixing directions on the package.  Fill your device with the amount of saline solution as directed by the device instructions.  Stand over a sink and tilt your head sideways over the sink.  Place the spout of the device in your upper nostril (the one closer to the ceiling).  Gently pour or squeeze the saline solution into the nasal cavity. The liquid should drain to the lower nostril if you are not overly congested.  Gently blow your nose. Blowing too hard may cause ear pain.  Repeat in the other nostril.  Clean and rinse your device with clean water and then air-dry it. ARE THERE RISKS OF A SINUS RINSE?  Sinus rinse is generally very safe and effective. However, there are a few risks, which include:   A burning sensation in the sinuses. This may happen if you do not make the saline solution as directed. Make sure to follow all directions when making the saline solution.  Infection from contaminated water. This is rare, but possible.  Nasal irritation.   This information is not intended to replace advice given to you by your health care provider. Make sure you discuss any questions you have with your health care provider.   Document Released: 10/29/2013 Document Reviewed: 10/29/2013 Elsevier Interactive Patient Education 2016 [employer].  Sinusitis, Adult Sinusitis is redness, soreness, and inflammation of the paranasal sinuses.  Paranasal sinuses are air pockets within the bones of your face. They are located beneath your eyes, in the middle of your forehead, and above your eyes. In healthy paranasal sinuses, mucus is able to drain out, and air is able to circulate through them by way of your nose. However, when your paranasal sinuses are inflamed, mucus and air can become trapped. This can allow bacteria and other germs to grow and cause infection. Sinusitis can develop quickly and last only a short time (acute)  or continue over a long period (chronic). Sinusitis that lasts for more than 12 weeks is considered chronic. CAUSES Causes of sinusitis include:  Allergies.  Structural abnormalities, such as displacement of the cartilage that separates your nostrils (deviated septum), which can decrease the air flow through your nose and sinuses and affect sinus drainage.  Functional abnormalities, such as when the small hairs (cilia) that line your sinuses and help remove mucus do not work properly or are not present. SIGNS AND SYMPTOMS Symptoms of acute and chronic sinusitis are the same. The primary symptoms are pain and pressure around the affected sinuses. Other symptoms include:  Upper toothache.  Earache.  Headache.  Bad breath.  Decreased sense of smell and taste.  A cough, which worsens when you are lying flat.  Fatigue.  Fever.  Thick drainage from your nose, which often is green and may contain pus (purulent).  Swelling and warmth over the affected sinuses. DIAGNOSIS Your health care provider will perform a physical exam. During your exam, your health care provider may perform any of the following to help determine if you have acute sinusitis or chronic sinusitis:  Look in your nose for signs of abnormal growths in your nostrils (nasal polyps).  Tap over the affected sinus to check for signs of infection.  View the inside of your sinuses using an imaging device that has a light attached (endoscope). If your health care provider suspects that you have chronic sinusitis, one or more of the following tests may be recommended:  Allergy tests.  Nasal culture. A sample of mucus is taken from your nose, sent to a lab, and screened for bacteria.  Nasal cytology. A sample of mucus is taken from your nose and examined by your health care provider to determine if your sinusitis is related to an allergy. TREATMENT Most cases of acute sinusitis are related to a viral infection and will  resolve on their own within 10 days. Sometimes, medicines are prescribed to help relieve symptoms of both acute and chronic sinusitis. These may include pain medicines, decongestants, nasal steroid sprays, or saline sprays. However, for sinusitis related to a bacterial infection, your health care provider will prescribe antibiotic medicines. These are medicines that will help kill the bacteria causing the infection. Rarely, sinusitis is caused by a fungal infection. In these cases, your health care provider will prescribe antifungal medicine. For some cases of chronic sinusitis, surgery is needed. Generally, these are cases in which sinusitis recurs more than 3 times per year, despite other treatments. HOME CARE INSTRUCTIONS  Drink plenty of water. Water helps thin the mucus so your sinuses can drain more easily.  Use a humidifier.  Inhale steam 3-4 times a day (for example, sit in the bathroom with the shower running).  Apply a warm, moist washcloth to your face 3-4 times a day, or as directed by your health care provider.  Use saline nasal sprays to help moisten and clean your sinuses.  Take medicines only as directed  by your health care provider.  If you were prescribed either an antibiotic or antifungal medicine, finish it all even if you start to feel better. SEEK IMMEDIATE MEDICAL CARE IF:  You have increasing pain or severe headaches.  You have nausea, vomiting, or drowsiness.  You have swelling around your face.  You have vision problems.  You have a stiff neck.  You have difficulty breathing.   This information is not intended to replace advice given to you by your health care provider. Make sure you discuss any questions you have with your health care provider.   Document Released: 04/03/2005 Document Revised: 04/24/2014 Document Reviewed: 04/18/2011 Elsevier Interactive Patient Education [employer].

## 2015-07-05 NOTE — ED Notes (Signed)
Pt c/o 2 1/2 weeks of cold symptoms that improved then turned into sinus pressure, HA and colored nasal discharge. Afebrile. Taken IBF and tylenol otc.

## 2015-12-10 ENCOUNTER — Ambulatory Visit (INDEPENDENT_AMBULATORY_CARE_PROVIDER_SITE_OTHER): Payer: Managed Care, Other (non HMO) | Admitting: Family Medicine

## 2015-12-10 VITALS — BP 109/75 | HR 64 | Wt 185.0 lb

## 2015-12-10 DIAGNOSIS — Z23 Encounter for immunization: Secondary | ICD-10-CM | POA: Diagnosis not present

## 2015-12-10 DIAGNOSIS — R7989 Other specified abnormal findings of blood chemistry: Secondary | ICD-10-CM

## 2015-12-10 DIAGNOSIS — Z111 Encounter for screening for respiratory tuberculosis: Secondary | ICD-10-CM | POA: Diagnosis not present

## 2015-12-10 DIAGNOSIS — E782 Mixed hyperlipidemia: Secondary | ICD-10-CM

## 2015-12-10 DIAGNOSIS — IMO0001 Reserved for inherently not codable concepts without codable children: Secondary | ICD-10-CM

## 2015-12-10 DIAGNOSIS — Z0000 Encounter for general adult medical examination without abnormal findings: Secondary | ICD-10-CM

## 2015-12-10 DIAGNOSIS — E291 Testicular hypofunction: Secondary | ICD-10-CM

## 2015-12-10 DIAGNOSIS — Z139 Encounter for screening, unspecified: Secondary | ICD-10-CM

## 2015-12-10 NOTE — Progress Notes (Signed)
       Jose Lutz is a 40 y.o. male who presents to [facility] today for well visit. Patient is doing well with no active medical problems. He was found to have low testosterone at the last check but was unable to get repeat labs to confirm this possible diagnosis. He notes that he continues to experience some mild erectile dysfunction that is well treated with sildenafil. He otherwise is doing great. He exercises and stretches to stay fit and healthy. He notes that he requires a flu vaccine and PPD for his job today.   Past Medical History:  Diagnosis Date  . Migraines   . Quadriceps tendon rupture    Past Surgical History:  Procedure Laterality Date  . ORCHIECTOMY    . testicular torsion     Social History  Substance Use Topics  . Smoking status: Never Smoker  . Smokeless tobacco: Not on file  . Alcohol use No   family history includes Arthritis in his other; Cancer in his other; Migraines in his other.  ROS as above: No headache, visual changes, nausea, vomiting, diarrhea, constipation, dizziness, abdominal pain, skin rash, fevers, chills, night sweats, weight loss, swollen lymph nodes, body aches, joint swelling, muscle aches, chest pain, shortness of breath, mood changes, visual or auditory hallucinations.    Medications: Current Outpatient Prescriptions  Medication Sig Dispense Refill  . Desloratadine (CLARINEX PO) Take by mouth.    . sildenafil (REVATIO) 20 MG tablet Take 1 tablet (20 mg total) by mouth as needed. 50 tablet 11   No current facility-administered medications for this visit.    No Known Allergies   Exam:  BP 109/75   Pulse 64   Wt 185 lb (83.9 kg)   BMI 24.41 kg/m  Gen: Well NAD HEENT: EOMI,  MMM Lungs: Normal work of breathing. CTABL Heart: RRR no MRG Abd: NABS, Soft. Nondistended, Nontender Exts: Brisk capillary refill,  warm and well perfused.   No results found for this or any previous visit (from the past 24 hour(s)). No results found.    Assessment and Plan: 40 y.o. male with Well adult. Doing great with minimal active medical problems. We'll obtain routine basic fasting labs as well as provide flu vaccine and place PPD.  Additionally will recheck testosterone LH FSH and PSA for possible low testosterone and possible testosterone supplementation in the near future.  Falls well return in one year or sooner if needed.   Orders Placed This Encounter  Procedures  . Flu Vaccine QUAD 36+ mos IM  . CBC  . Comprehensive metabolic panel    Order Specific Question:   Has the patient fasted?    Answer:   No  . Lipid panel    Order Specific Question:   Has the patient fasted?    Answer:   No  . Testosterone  . Follicle stimulating hormone  . LH  . PSA  . PPD    Order Specific Question:   Has patient ever tested positive?    Answer:   No    Discussed warning signs or symptoms. Please see discharge instructions. Patient expresses understanding.

## 2015-12-10 NOTE — Patient Instructions (Signed)
Thank you for coming in today. Ger labs Monday morning fasting.  We will let you know the results.

## 2015-12-13 ENCOUNTER — Ambulatory Visit (INDEPENDENT_AMBULATORY_CARE_PROVIDER_SITE_OTHER): Payer: Managed Care, Other (non HMO) | Admitting: Family Medicine

## 2015-12-13 VITALS — BP 105/66 | HR 63 | Wt 185.0 lb

## 2015-12-13 DIAGNOSIS — Z111 Encounter for screening for respiratory tuberculosis: Secondary | ICD-10-CM

## 2015-12-13 DIAGNOSIS — Z7689 Persons encountering health services in other specified circumstances: Secondary | ICD-10-CM | POA: Diagnosis not present

## 2015-12-13 LAB — TB SKIN TEST
Induration: 0 mm
TB SKIN TEST: NEGATIVE

## 2015-12-13 NOTE — Progress Notes (Signed)
Patient came into clinic today for PPD read. Test was negative. Pt form for work was completed and stamped. No further questions/concerns at this time.

## 2015-12-14 ENCOUNTER — Encounter: Payer: Self-pay | Admitting: Family Medicine

## 2015-12-14 LAB — COMPREHENSIVE METABOLIC PANEL
ALBUMIN: 4.3 g/dL (ref 3.6–5.1)
ALT: 18 U/L (ref 9–46)
AST: 17 U/L (ref 10–40)
Alkaline Phosphatase: 55 U/L (ref 40–115)
BILIRUBIN TOTAL: 0.7 mg/dL (ref 0.2–1.2)
BUN: 16 mg/dL (ref 7–25)
CHLORIDE: 107 mmol/L (ref 98–110)
CO2: 26 mmol/L (ref 20–31)
CREATININE: 1.05 mg/dL (ref 0.60–1.35)
Calcium: 8.9 mg/dL (ref 8.6–10.3)
Glucose, Bld: 98 mg/dL (ref 65–99)
Potassium: 4 mmol/L (ref 3.5–5.3)
SODIUM: 138 mmol/L (ref 135–146)
TOTAL PROTEIN: 6.5 g/dL (ref 6.1–8.1)

## 2015-12-14 LAB — PSA: PSA: 0.6 ng/mL (ref <=4.0)

## 2015-12-14 LAB — FOLLICLE STIMULATING HORMONE: FSH: 5.7 m[IU]/mL (ref 1.6–8.0)

## 2015-12-14 LAB — LIPID PANEL
CHOLESTEROL: 150 mg/dL (ref 125–200)
HDL: 42 mg/dL (ref >=40)
LDL CALC: 90 mg/dL (ref <130)
TRIGLYCERIDES: 88 mg/dL (ref <150)
Total CHOL/HDL Ratio: 3.6 Ratio (ref <=5.0)
VLDL: 18 mg/dL (ref <30)

## 2015-12-14 LAB — CBC
HCT: 38 % — ABNORMAL LOW (ref 38.5–50.0)
HEMOGLOBIN: 12 g/dL — AB (ref 13.2–17.1)
MCH: 19.5 pg — AB (ref 27.0–33.0)
MCHC: 31.6 g/dL — ABNORMAL LOW (ref 32.0–36.0)
MCV: 61.7 fL — ABNORMAL LOW (ref 80.0–100.0)
PLATELETS: 224 10*3/uL (ref 140–400)
RBC: 6.16 MIL/uL — AB (ref 4.20–5.80)
RDW: 17.3 % — ABNORMAL HIGH (ref 11.0–15.0)
WBC: 5.4 10*3/uL (ref 3.8–10.8)

## 2015-12-14 LAB — LUTEINIZING HORMONE: LH: 2.9 m[IU]/mL (ref 1.5–9.3)

## 2015-12-14 LAB — TESTOSTERONE: TESTOSTERONE: 281 ng/dL (ref 250–827)

## 2015-12-16 ENCOUNTER — Ambulatory Visit (INDEPENDENT_AMBULATORY_CARE_PROVIDER_SITE_OTHER): Payer: Managed Care, Other (non HMO) | Admitting: Family Medicine

## 2015-12-16 ENCOUNTER — Encounter: Payer: Self-pay | Admitting: Family Medicine

## 2015-12-16 VITALS — BP 94/58 | HR 84 | Ht 73.0 in | Wt 182.0 lb

## 2015-12-16 DIAGNOSIS — R7989 Other specified abnormal findings of blood chemistry: Secondary | ICD-10-CM

## 2015-12-16 DIAGNOSIS — E291 Testicular hypofunction: Secondary | ICD-10-CM | POA: Diagnosis not present

## 2015-12-16 MED ORDER — TESTOSTERONE CYPIONATE 200 MG/ML IM SOLN: 200.0000 mg | INTRAMUSCULAR | 0 refills | Status: DC

## 2015-12-16 MED ORDER — AMBULATORY NON FORMULARY MEDICATION: 3 refills | Status: DC

## 2015-12-16 MED ORDER — TESTOSTERONE CYPIONATE 200 MG/ML IM SOLN
200.0000 mg | Freq: Once | INTRAMUSCULAR | Status: AC
  Administered 2015-12-16: 200 mg via INTRAMUSCULAR

## 2015-12-16 NOTE — Progress Notes (Signed)
       Jose Lutz is a 40 y.o. male who presents to [facility] [place] today for follow-up for low testosterone. Patient has been evaluated several times and found to have low testosterone. He notes erectile dysfunction and mild fatigue as well. He is interested in testosterone supplementation today.   Past Medical History:  Diagnosis Date  . Low testosterone   . Migraines   . Quadriceps tendon rupture   . Testicular torsion   . Thalassemia    Past Surgical History:  Procedure Laterality Date  . ORCHIECTOMY    . testicular torsion     Social History  Substance Use Topics  . Smoking status: Never Smoker  . Smokeless tobacco: Not on file  . Alcohol use No   family history includes Arthritis in his other; Cancer in his other; Migraines in his other.  ROS as above:  Medications: Current Outpatient Prescriptions  Medication Sig Dispense Refill  . Desloratadine (CLARINEX PO) Take by mouth.    . sildenafil (REVATIO) 20 MG tablet Take 1 tablet (20 mg total) by mouth as needed. 50 tablet 11  . AMBULATORY NON FORMULARY MEDICATION 3 ml Syringes, 21-gauge 1 inch needles, 18 inch blunt filler needles for use with testosterone injections every 2 weeks. Dispense quantity sufficient for 3 months 1 each 3  . testosterone cypionate (DEPOTESTOSTERONE CYPIONATE) 200 MG/ML injection Inject 1 mL (200 mg total) into the muscle every 14 (fourteen) days. 10 mL 0   No current facility-administered medications for this visit.    No Known Allergies   Exam:  BP (!) 94/58   Pulse 84   Ht 6\' 1"  (1.854 m)   Wt 182 lb (82.6 kg)   BMI 24.01 kg/m  Gen: Well NAD  Lab Results  Component Value Date   TESTOSTERONE 281 12/13/2015    No results found for this or any previous visit (from the past 24 hour(s)). No results found.    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 Start depo testosterone today. Nurse visit in 2 weeks for teaching. Recheck labs in about 2 months.   No orders of the defined types were placed in this encounter.   Discussed warning signs or symptoms. Please see discharge instructions. Patient expresses understanding.

## 2015-12-16 NOTE — Patient Instructions (Signed)
Thank you for coming in today. Return in 2 weeks for nurse visit with your testosterone supplies to show how to do the injections. Recheck labs with a visit in about 2 months  Testosterone injection What is this medicine? TESTOSTERONE (tes TOS ter one) is the main male hormone. It supports normal male development such as muscle growth, facial hair, and deep voice. It is used in males to treat low testosterone levels. This medicine may be used for other purposes; ask your health care provider or pharmacist if you have questions. What should I tell my health care provider before I take this medicine? They need to know if you have any of these conditions: -breast cancer -diabetes -heart disease -kidney disease -liver disease -lung disease -prostate cancer, enlargement -an unusual or allergic reaction to testosterone, other medicines, foods, dyes, or preservatives -pregnant or trying to get pregnant -breast-feeding How should I use this medicine? This medicine is for injection into a muscle. It is usually given by a health care professional in a hospital or clinic setting. Contact your pediatrician regarding the use of this medicine in children. While this medicine may be prescribed for children as young as 58 years of age for selected conditions, precautions do apply. Overdosage: If you think you have taken too much of this medicine contact a poison control center or emergency room at once. NOTE: This medicine is only for you. Do not share this medicine with others. What if I miss a dose? Try not to miss a dose. Your doctor or health care professional will tell you when your next injection is due. Notify the office if you are unable to keep an appointment. What may interact with this medicine? -medicines for diabetes -medicines that treat or prevent blood clots like warfarin -oxyphenbutazone -propranolol -steroid medicines like prednisone or cortisone This list may not describe all  possible interactions. Give your health care provider a list of all the medicines, herbs, non-prescription drugs, or dietary supplements you use. Also tell them if you smoke, drink alcohol, or use illegal drugs. Some items may interact with your medicine. What should I watch for while using this medicine? Visit your doctor or health care professional for regular checks on your progress. They will need to check the level of testosterone in your blood. This medicine is only approved for use in men who have low levels of testosterone related to certain medical conditions. Heart attacks and strokes have been reported with the use of this medicine. Notify your doctor or health care professional and seek emergency treatment if you develop breathing problems; changes in vision; confusion; chest pain or chest tightness; sudden arm pain; severe, sudden headache; trouble speaking or understanding; sudden numbness or weakness of the face, arm or leg; loss of balance or coordination. Talk to your doctor about the risks and benefits of this medicine. This medicine may affect blood sugar levels. If you have diabetes, check with your doctor or health care professional before you change your diet or the dose of your diabetic medicine. This drug is banned from use in athletes by most athletic organizations. What side effects may I notice from receiving this medicine? Side effects that you should report to your doctor or health care professional as soon as possible: -allergic reactions like skin rash, itching or hives, swelling of the face, lips, or tongue -breast enlargement -breathing problems -changes in mood, especially anger, depression, or rage -dark urine -general ill feeling or flu-like symptoms -light-colored stools -loss of appetite, nausea -nausea,  vomiting -right upper belly pain -stomach pain -swelling of ankles -too frequent or persistent erections -trouble passing urine or change in the amount of  urine -unusually weak or tired -yellowing of the eyes or skin Additional side effects that can occur in women include: -deep or hoarse voice -facial hair growth -irregular menstrual periods Side effects that usually do not require medical attention (report to your doctor or health care professional if they continue or are bothersome): -acne -change in sex drive or performance -hair loss -headache This list may not describe all possible side effects. Call your doctor for medical advice about side effects. You may report side effects to FDA at 1-800-FDA-1088. Where should I keep my medicine? Keep out of the reach of children. This medicine can be abused. Keep your medicine in a safe place to protect it from theft. Do not share this medicine with anyone. Selling or giving away this medicine is dangerous and against the law. Store at room temperature between 20 and 25 degrees C (68 and 77 degrees F). Do not freeze. Protect from light. Follow the directions for the product you are prescribed. Throw away any unused medicine after the expiration date. NOTE: This sheet is a summary. It may not cover all possible information. If you have questions about this medicine, talk to your doctor, pharmacist, or health care provider.    2016, Elsevier/Gold Standard. (2013-06-19 [id number])

## 2015-12-30 ENCOUNTER — Ambulatory Visit (INDEPENDENT_AMBULATORY_CARE_PROVIDER_SITE_OTHER): Payer: Managed Care, Other (non HMO) | Admitting: Family Medicine

## 2015-12-30 VITALS — BP 98/67 | HR 86 | Ht 73.0 in | Wt 182.0 lb

## 2015-12-30 DIAGNOSIS — R7989 Other specified abnormal findings of blood chemistry: Secondary | ICD-10-CM

## 2015-12-30 DIAGNOSIS — E291 Testicular hypofunction: Secondary | ICD-10-CM | POA: Diagnosis not present

## 2015-12-30 MED ORDER — TESTOSTERONE CYPIONATE 200 MG/ML IM SOLN
200.0000 mg | Freq: Once | INTRAMUSCULAR | Status: AC
  Administered 2015-12-30: 200 mg via INTRAMUSCULAR

## 2015-12-30 NOTE — Progress Notes (Signed)
Pt in this morning for testosterone injection.  Pt and I went through the education on how to self administer an IM injection & pt successfully injected himself in the right vastus lateralis. 200mg .  No immediate complications. I also gave pt the lab order to have his testosterone level checked in one month and 1 week after injection.

## 2016-04-05 ENCOUNTER — Other Ambulatory Visit: Payer: Self-pay | Admitting: Family Medicine

## 2016-04-05 DIAGNOSIS — N529 Male erectile dysfunction, unspecified: Secondary | ICD-10-CM

## 2016-06-17 LAB — TESTOSTERONE: TESTOSTERONE: 287 ng/dL (ref 250–827)

## 2016-06-19 ENCOUNTER — Encounter: Payer: Self-pay | Admitting: Family Medicine

## 2016-06-19 ENCOUNTER — Ambulatory Visit (INDEPENDENT_AMBULATORY_CARE_PROVIDER_SITE_OTHER): Payer: 59 | Admitting: Family Medicine

## 2016-06-19 DIAGNOSIS — E349 Endocrine disorder, unspecified: Secondary | ICD-10-CM

## 2016-06-19 DIAGNOSIS — R7989 Other specified abnormal findings of blood chemistry: Secondary | ICD-10-CM

## 2016-06-19 MED ORDER — TESTOSTERONE CYPIONATE 200 MG/ML IM SOLN: 200.0000 mg | INTRAMUSCULAR | 0 refills | Status: DC

## 2016-06-19 MED ORDER — AMBULATORY NON FORMULARY MEDICATION: 12 refills | Status: DC

## 2016-06-19 NOTE — Patient Instructions (Signed)
Thank you for coming in today. Recheck in August for  Well exam.  Let me know ahead of time and we will get labs.  Make sure to tell me what special titers we need for your employer.

## 2016-06-19 NOTE — Progress Notes (Signed)
       Jose Lutz is a 41 y.o. male who presents to [place]: [place] today for follow up testosterone.   He takes 200 mg of IM testosterone every 2 weeks and feels well. He notes improved fatigue libido and erections. He denies any fevers or chills nausea vomiting or diarrhea. He notes the testosterone was drawn right before he received his last shot.     Past Medical History:  Diagnosis Date  . Low testosterone   . Migraines   . Quadriceps tendon rupture   . Testicular torsion   . Thalassemia    Past Surgical History:  Procedure Laterality Date  . ORCHIECTOMY    . testicular torsion     Social History  Substance Use Topics  . Smoking status: Never Smoker  . Smokeless tobacco: Never Used  . Alcohol use No   family history includes Arthritis in his other; Cancer in his other; Migraines in his other.  ROS as above:  Medications: Current Outpatient Prescriptions  Medication Sig Dispense Refill  . AMBULATORY NON FORMULARY MEDICATION 3 ml Syringes, 21-gauge 1 inch needles, 18 inch blunt filler needles for use with testosterone injections every 2 weeks. Dispense quantity sufficient for 3 months 1 each 12  . Desloratadine (CLARINEX PO) Take by mouth.    . sildenafil (REVATIO) 20 MG tablet TAKE 2-5 TABLETS BY MOUTH AS NEEDED FOR SEXUAL ACTIVITY 50 tablet 11  . testosterone cypionate (DEPOTESTOSTERONE CYPIONATE) 200 MG/ML injection Inject 1 mL (200 mg total) into the muscle every 14 (fourteen) days. 12 mL 0   No current facility-administered medications for this visit.    No Known Allergies  Health Maintenance Health Maintenance  Topic Date Due  . TETANUS/TDAP  12/04/2022  . INFLUENZA VACCINE  Completed  . HIV Screening  Completed     Exam:  BP 117/62   Pulse 67   Wt 187 lb (84.8 kg)   BMI 24.67 kg/m  Gen: Well NAD HEENT: EOMI,  MMM Lungs: Normal  work of breathing. CTABL Heart: RRR no MRG Abd: NABS, Soft. Nondistended, Nontender Exts: Brisk capillary refill, warm and well perfused.   Lab Results  Component Value Date   TESTOSTERONE 287 06/16/2016     No results found for this or any previous visit (from the past 72 hour(s)). No results found.    Assessment and Plan: 41 y.o. male with Low testosterone: The level was a bit low today because I think it was drawn too late in the cycle. He seems to be doing well. We'll recheck in 6 months for a wellness visit and I will check other labs at that time as well.   No orders of the defined types were placed in this encounter.  Meds ordered this encounter  Medications  . testosterone cypionate (DEPOTESTOSTERONE CYPIONATE) 200 MG/ML injection    Sig: Inject 1 mL (200 mg total) into the muscle every 14 (fourteen) days.    Dispense:  12 mL    Refill:  0  . AMBULATORY NON FORMULARY MEDICATION    Sig: 3 ml Syringes, 21-gauge 1 inch needles, 18 inch blunt filler needles for use with testosterone injections every 2 weeks. Dispense quantity sufficient for 3 months    Dispense:  1 each    Refill:  12     Discussed warning signs or symptoms. Please see discharge instructions. Patient expresses understanding.

## 2016-11-20 ENCOUNTER — Other Ambulatory Visit: Payer: Self-pay

## 2016-11-20 DIAGNOSIS — R7989 Other specified abnormal findings of blood chemistry: Secondary | ICD-10-CM

## 2016-11-20 MED ORDER — TESTOSTERONE CYPIONATE 200 MG/ML IM SOLN: 200.0000 mg | INTRAMUSCULAR | 0 refills | Status: DC

## 2016-11-20 NOTE — Telephone Encounter (Signed)
Patient request refill for  Testosterone. Patient has appoitnment for 12/14/2016. And he was advised to keep scheduled appointment. Rhonda Cunningham,CMA

## 2016-12-14 ENCOUNTER — Ambulatory Visit (INDEPENDENT_AMBULATORY_CARE_PROVIDER_SITE_OTHER): Payer: 59 | Admitting: Family Medicine

## 2016-12-14 ENCOUNTER — Encounter: Payer: Self-pay | Admitting: Family Medicine

## 2016-12-14 VITALS — BP 110/70 | HR 71 | Ht 73.0 in | Wt 182.0 lb

## 2016-12-14 DIAGNOSIS — M722 Plantar fascial fibromatosis: Secondary | ICD-10-CM | POA: Insufficient documentation

## 2016-12-14 DIAGNOSIS — R7989 Other specified abnormal findings of blood chemistry: Secondary | ICD-10-CM

## 2016-12-14 DIAGNOSIS — Z0000 Encounter for general adult medical examination without abnormal findings: Secondary | ICD-10-CM | POA: Diagnosis not present

## 2016-12-14 DIAGNOSIS — Z23 Encounter for immunization: Secondary | ICD-10-CM | POA: Diagnosis not present

## 2016-12-14 DIAGNOSIS — N529 Male erectile dysfunction, unspecified: Secondary | ICD-10-CM | POA: Diagnosis not present

## 2016-12-14 MED ORDER — SILDENAFIL CITRATE 20 MG PO TABS: ORAL_TABLET | ORAL | 11 refills | Status: AC

## 2016-12-14 NOTE — Progress Notes (Signed)
Jose Lutz is a 41 y.o. male who presents to [facility]: [facility] today for well adult. Jose Lutz is doing quite well. 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 He notes that he continues to exercise regularly and feels generally well.  He was started on testosterone several months ago and notes that it significantly has improved his libido and fatigue symptoms.  He does however note mild pain in his right heel. This is been ongoing for about 6 months and is located lateral aspect of the calcaneus. He suspects plantar fasciitis. Symptoms are mild to moderate and typically worse when he steps out of bed in the morning.  Lastly he typically gets titers every year or 2 as part of his employment. 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   Past Medical History:  Diagnosis Date  . Low testosterone   . Migraines   . Quadriceps tendon rupture   . Testicular torsion   . Thalassemia    Past Surgical History:  Procedure Laterality Date  . ORCHIECTOMY    . testicular torsion     Social History  Substance Use Topics  . Smoking status: Never Smoker  . Smokeless tobacco: Never Used  . Alcohol use No   family history includes Arthritis in his other; Cancer in his other; Migraines in his other.  ROS as above:  Medications: Current Outpatient Prescriptions  Medication Sig Dispense Refill  . AMBULATORY NON FORMULARY MEDICATION 3 ml Syringes, 21-gauge 1 inch needles, 18 inch blunt filler needles for use with testosterone injections every 2 weeks. Dispense quantity sufficient for 3 months 1 each 12  . Desloratadine (CLARINEX PO) Take by mouth.    . sildenafil (REVATIO) 20 MG tablet TAKE 2-5 TABLETS BY MOUTH AS NEEDED FOR SEXUAL ACTIVITY 50 tablet 11   . testosterone cypionate (DEPOTESTOSTERONE CYPIONATE) 200 MG/ML injection Inject 1 mL (200 mg total) into the muscle every 14 (fourteen) days. MUST KEEP APPOINTMENT 12 mL 0   No current facility-administered medications for this visit.    No Known Allergies  Health Maintenance Health Maintenance  Topic Date Due  . INFLUENZA VACCINE  11/15/2016  . TETANUS/TDAP  12/04/2022  . HIV Screening  Completed     Exam:  BP 110/70   Pulse 71   Ht _0  (1.854 m)   Wt 182 lb (82.6 kg)   BMI 24.01 kg/m  Gen: Well NAD HEENT: EOMI,  MMM Lungs: Normal work of breathing. CTABL Heart: RRR no MRG Abd: NABS, Soft. Nondistended, Nontender Exts: Brisk capillary refill, warm and well perfused.  MSK: Right foot normal appearing normal motion tenderness palpation right medial calcaneus pulses capillary refill and sensation intact. Skin: Multiple nevi present none significantly dysplastic appearing   No results found for this or any previous visit (from the past 72 hour(s)). No results found.    Assessment and Plan: 41 y.o. male with  Well adult. Doing well overall. He remains active and healthy.  Low testosterone: We'll check basic fasting labs including testosterone and lipid panel.  Plantar fasciitis: Plan for ice massage and eccentric heel exercises.  Titers pending. Patient will recheck for PPD with a nurse visit in the near future.     Orders Placed This Encounter  Procedures  . Flu Vaccine QUAD 36+ mos  IM  . Lipid Panel w/reflex Direct LDL  . COMPLETE METABOLIC PANEL WITH GFR  . PSA  . Measles/Mumps/Rubella Immunity  . Varicella zoster antibody, IgG  . Hepatitis B surface antibody  . Testosterone   Meds ordered this encounter  Medications  . sildenafil (REVATIO) 20 MG tablet    Sig: TAKE 2-5 TABLETS BY MOUTH AS NEEDED FOR SEXUAL ACTIVITY    Dispense:  50 tablet    Refill:  11    Generic For:*REVATIO 20MG     Discussed warning signs or symptoms. Please see  discharge instructions. Patient expresses understanding.

## 2016-12-14 NOTE — Patient Instructions (Addendum)
Thank you for coming in today.  Get labs in   [place], [place], [place] [address]  [phone]  Get fasting labs in the near future at home.   Do the exercises for Planar Fasciitis.  30 reps 2-3x daily.   Get PPD testing with a nurse visit soonish.

## 2017-02-23 LAB — MEASLES/MUMPS/RUBELLA IMMUNITY
MUMPS IGG: 48.1 [AU]/ml
RUBELLA: 2.5 {index}
Rubeola IgG: 66.8 AU/mL

## 2017-02-23 LAB — COMPLETE METABOLIC PANEL WITH GFR
AG Ratio: 2.1 (calc) (ref 1.0–2.5)
ALT: 26 U/L (ref 9–46)
AST: 18 U/L (ref 10–40)
Albumin: 4.5 g/dL (ref 3.6–5.1)
Alkaline phosphatase (APISO): 53 U/L (ref 40–115)
BUN: 15 mg/dL (ref 7–25)
CALCIUM: 9.2 mg/dL (ref 8.6–10.3)
CO2: 27 mmol/L (ref 20–32)
CREATININE: 1 mg/dL (ref 0.60–1.35)
Chloride: 105 mmol/L (ref 98–110)
GFR, EST AFRICAN AMERICAN: 106 mL/min/{1.73_m2} (ref >=60)
GFR, EST NON AFRICAN AMERICAN: 92 mL/min/{1.73_m2} (ref >=60)
GLUCOSE: 100 mg/dL — AB (ref 65–99)
Globulin: 2.1 g/dL (calc) (ref 1.9–3.7)
Potassium: 4.1 mmol/L (ref 3.5–5.3)
Sodium: 138 mmol/L (ref 135–146)
TOTAL PROTEIN: 6.6 g/dL (ref 6.1–8.1)
Total Bilirubin: 0.8 mg/dL (ref 0.2–1.2)

## 2017-02-23 LAB — LIPID PANEL W/REFLEX DIRECT LDL
Cholesterol: 154 mg/dL (ref <200)
HDL: 39 mg/dL — ABNORMAL LOW (ref >40)
LDL Cholesterol (Calc): 100 mg/dL (calc) — ABNORMAL HIGH
Non-HDL Cholesterol (Calc): 115 mg/dL (calc) (ref <130)
Total CHOL/HDL Ratio: 3.9 (calc) (ref <5.0)
Triglycerides: 66 mg/dL (ref <150)

## 2017-02-23 LAB — TESTOSTERONE: TESTOSTERONE: 993 ng/dL — AB (ref 250–827)

## 2017-02-23 LAB — PSA: PSA: 0.7 ng/mL (ref <=4.0)

## 2017-02-23 LAB — VARICELLA ZOSTER ANTIBODY, IGG: Varicella IgG: 799.9 index

## 2017-02-23 LAB — HEPATITIS B SURFACE ANTIBODY,QUALITATIVE: Hep B S Ab: REACTIVE — AB

## 2017-02-27 ENCOUNTER — Other Ambulatory Visit: Payer: Self-pay

## 2017-02-27 DIAGNOSIS — R7989 Other specified abnormal findings of blood chemistry: Secondary | ICD-10-CM

## 2017-02-27 MED ORDER — AMBULATORY NON FORMULARY MEDICATION: 12 refills | Status: AC

## 2017-02-27 MED ORDER — TESTOSTERONE CYPIONATE 200 MG/ML IM SOLN
200.0000 mg | INTRAMUSCULAR | 0 refills | Status: DC
Start: 2017-02-27 — End: 2017-09-13

## 2017-03-06 ENCOUNTER — Telehealth: Payer: Self-pay

## 2017-03-06 NOTE — Telephone Encounter (Signed)
Corene Cornea from [facility] called and requested conformation on patients Rx of Testosterone to verify. Spoke with them and confirm Rx and dosage

## 2017-05-21 ENCOUNTER — Ambulatory Visit (INDEPENDENT_AMBULATORY_CARE_PROVIDER_SITE_OTHER): Payer: 59 | Admitting: Physician Assistant

## 2017-05-21 ENCOUNTER — Encounter: Payer: Self-pay | Admitting: Physician Assistant

## 2017-05-21 VITALS — BP 118/75 | HR 69 | Temp 98.1°F | Wt 189.0 lb

## 2017-05-21 DIAGNOSIS — M25461 Effusion, right knee: Secondary | ICD-10-CM | POA: Diagnosis not present

## 2017-05-21 NOTE — Progress Notes (Signed)
HPI:                                                                Jose Lutz is a 42 y.o. male who presents to [place]: [facility] today for right knee swelling  Patient reports sudden onset, atraumatic right knee swelling x 1 week. States he can feel fluid on his knee cap. He denies any recent or remote injury to the knee. Denies any knee pain whatsoever. ROM is normal. Denies fever, chills, redness, or warmth of the joint. He has been icing the knee and applying compression without any change in symptoms.  Depression screen [facility] 2/9 12/14/2016 06/19/2016  Decreased Interest 0 0  Down, Depressed, Hopeless 0 0  PHQ - 2 Score 0 0    No flowsheet data found.    Past Medical History:  Diagnosis Date  . Low testosterone   . Migraines   . Quadriceps tendon rupture   . Testicular torsion   . Thalassemia    Past Surgical History:  Procedure Laterality Date  . ORCHIECTOMY    . testicular torsion     Social History   Tobacco Use  . Smoking status: Never Smoker  . Smokeless tobacco: Never Used  Substance Use Topics  . Alcohol use: No   family history includes Arthritis in his other; Cancer in his other; Migraines in his other.    ROS: negative except as noted in the HPI  Medications: Current Outpatient Medications  Medication Sig Dispense Refill  . AMBULATORY NON FORMULARY MEDICATION 3 ml Syringes, 21-gauge 1.5 inch needles, 18-gauge inch blunt filler needles for use with testosterone injections every 2 weeks. Dispense quantity sufficient for 3 months 1 each 12  . Desloratadine (CLARINEX PO) Take by mouth.    . sildenafil (REVATIO) 20 MG tablet TAKE 2-5 TABLETS BY MOUTH AS NEEDED FOR SEXUAL ACTIVITY 50 tablet 11  . testosterone cypionate (DEPOTESTOSTERONE CYPIONATE) 200 MG/ML injection Inject 1 mL (200 mg total) every 14 (fourteen) days into the muscle. 12 mL 0   No current facility-administered medications for this visit.     No Known Allergies     Objective:  BP 118/75   Pulse 69   Temp 98.1 F (36.7 C) (Oral)   Wt 189 lb (85.7 kg)   BMI 24.94 kg/m  Gen:  alert, not ill-appearing, no distress, appropriate for age 89: head normocephalic without obvious abnormality, conjunctiva and cornea clear, trachea midline Pulm: Normal work of breathing, normal phonation Neuro: alert and oriented x 3, no tremor MSK: right knee visible swollen with prepatellar effusion, joint is stable, no joint line tenderness, no erythema or warmth, full active ROM of the knee, extremities atraumatic, normal gait and station, no peripheral edema Skin: intact, no rashes on exposed skin, no jaundice, no cyanosis Psych: well-groomed, cooperative, good eye contact, euthymic mood, affect mood-congruent, speech is articulate, and thought processes clear and goal-directed    No results found for this or any previous visit (from the past 72 hour(s)). No results found.    Assessment and Plan: 42 y.o. male with   1. Knee effusion, right - DG Knee Complete 4 Views Right  2. Effusion of right prepatellar bursa - Ibuprofen 800 mg TID - rehab exercises  daily - continue Ice x 20 mins 2-3 times daily - follow-up with Sports if no improvement in 4 weeks  Patient education and anticipatory guidance given Patient agrees with treatment plan Follow-up as needed if symptoms worsen or fail to improve  Darlyne Russian PA-C

## 2017-05-21 NOTE — Patient Instructions (Signed)
Knee Effusion Knee effusion means that you have excess fluid in your knee joint. This can cause pain and swelling in your knee. This may make your knee more difficult to bend and move. That is because there is increased pain and pressure in the joint. If there is fluid in your knee, it often means that something is wrong inside your knee, such as severe arthritis, abnormal inflammation, or an infection. Another common cause of knee effusion is an injury to the knee muscles, ligaments, or cartilage. Follow these instructions at home:  Use crutches as directed by your health care provider.  Wear a knee brace as directed by your health care provider.  Apply ice to the swollen area: ? Put ice in a plastic bag. ? Place a towel between your skin and the bag. ? Leave the ice on for 20 minutes, 2-3 times per day.  Keep your knee raised (elevated) when you are sitting or lying down.  Take medicines only as directed by your health care provider.  Do any rehabilitation or strengthening exercises as directed by your health care provider.  Rest your knee as directed by your health care provider. You may start doing your normal activities again when your health care provider approves.  Keep all follow-up visits as directed by your health care provider. This is important. Contact a health care provider if:  You have ongoing (persistent) pain in your knee. Get help right away if:  You have increased swelling or redness of your knee.  You have severe pain in your knee.  You have a fever. This information is not intended to replace advice given to you by your health care provider. Make sure you discuss any questions you have with your health care provider. Document Released: 06/24/2003 Document Revised: 09/09/2015 Document Reviewed: 11/17/2013 Elsevier Interactive Patient Education  2018 Elsevier Inc.  

## 2017-06-20 ENCOUNTER — Encounter: Payer: Self-pay | Admitting: Family Medicine

## 2017-06-20 ENCOUNTER — Ambulatory Visit: Payer: 59 | Admitting: Family Medicine

## 2017-06-20 VITALS — BP 115/63 | HR 89 | Ht 73.0 in | Wt 190.0 lb

## 2017-06-20 DIAGNOSIS — M25461 Effusion, right knee: Secondary | ICD-10-CM | POA: Diagnosis not present

## 2017-06-20 NOTE — Patient Instructions (Signed)
Thank you for coming in today. Contnue compression and padding for 1 month.  I recommend body helix full knee likely size medium.  Recheck with me as needed.  I will send you results of the culture of fluid evaluation.  Recheck as needed.

## 2017-06-20 NOTE — Progress Notes (Signed)
Jose Lutz is a 42 y.o. male who presents to [facility] today for right knee prepatellar bursitis.  Jose Lutz notes a several week history of right anterior knee swelling.  He cannot recall any specific trauma.  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  He was seen by my partner Nelson Chimes PA-C and Dr Dianah Field about a month ago on February 4.  He had a trial of conservative management with compression and oral NSAIDs and ice.  This is not helped in fact his knee swelling has worsened and become slightly more full.  He denies any redness or severe pain.  He denies any fevers or chills.  He notes kneeling or pressure on the knee exacerbates the pain.   Past Medical History:  Diagnosis Date  . Low testosterone   . Migraines   . Quadriceps tendon rupture   . Testicular torsion   . Thalassemia    Past Surgical History:  Procedure Laterality Date  . ORCHIECTOMY    . testicular torsion     Social History   Tobacco Use  . Smoking status: Never Smoker  . Smokeless tobacco: Never Used  Substance Use Topics  . Alcohol use: No     ROS:  As above   Medications: Current Outpatient Medications  Medication Sig Dispense Refill  . AMBULATORY NON FORMULARY MEDICATION 3 ml Syringes, 21-gauge 1.5 inch needles, 18-gauge inch blunt filler needles for use with testosterone injections every 2 weeks. Dispense quantity sufficient for 3 months 1 each 12  . Desloratadine (CLARINEX PO) Take by mouth.    . sildenafil (REVATIO) 20 MG tablet TAKE 2-5 TABLETS BY MOUTH AS NEEDED FOR SEXUAL ACTIVITY 50 tablet 11  . testosterone cypionate (DEPOTESTOSTERONE CYPIONATE) 200 MG/ML injection Inject 1 mL (200 mg total) every 14 (fourteen) days into the muscle. 12 mL 0   No current facility-administered medications for this visit.    No Known Allergies   Exam:  BP 115/63   Pulse 89   Ht 6\' 1"  (1.854 m)   Wt 190  lb (86.2 kg)   BMI 25.07 kg/m  General: Well Developed, well nourished, and in no acute distress.  Neuro/Psych: Alert and oriented x3, extra-ocular muscles intact, able to move all 4 extremities, sensation grossly intact. Skin: Warm and dry, no rashes noted.  Respiratory: Not using accessory muscles, speaking in full sentences, trachea midline.  Cardiovascular: Pulses palpable, no extremity edema. Abdomen: Does not appear distended. MSK:  Right knee: Large swollen area overlying the patella: Soft with free motion of fluid. No erythema or induration Minimally tender. Knee motion is intact.  Stable ligamentous exam Intact flexion and extension strength.  Limited musculoskeletal ultrasound of the right knee swelling reveals a large simple cystic structure with normal bony structures deep consistent in appearance with nonloculated prepatellar bursitis.  Aspiration and injection of prepatellar bursitis. Consent obtained and timeout performed. Skin cleaned with rubbing alcohol and cold spray applied.  1 mL of lidocaine without epinephrine was injected superficially achieving good anesthesia. The skin was again cleaned with alcohol and chlorhexidine. An 18-gauge needle was used to access the cystic structure and 15 mL of bloody fluid was removed. The syringe was exchanged and then 80 mg of Depo-Medrol and 1 mL of Marcaine were injected. Compressive dressing was applied.  Patient tolerated the procedure well.     Assessment and Plan: 42 y.o. male with right knee prepatellar bursitis failing conservative management.  Status post aspiration and injection today.  No trauma history or pain overlying the patella.  Very doubtful for any significant etiology such as fracture.  Patient has a good story of having recent healing prior to the swelling.  Plan for aspiration and injection and compression as noted above.  Of note: The fluid aspirate was discarded prior to labs being obtained.  Patient was  offered repeat aspiration for lab purposes and declined as was reasonable.  If diagnosis is uncertain would recommend repeat aspiration for further diagnosis.  Extremely doubtful for infection or unexpected etiology on fluid aspirate.  New problem with uncertain prognosis.   Discussed warning signs or symptoms. Please see discharge instructions. Patient expresses understanding.

## 2017-09-13 ENCOUNTER — Other Ambulatory Visit: Payer: Self-pay

## 2017-09-13 ENCOUNTER — Telehealth: Payer: Self-pay | Admitting: Family Medicine

## 2017-09-13 DIAGNOSIS — R7989 Other specified abnormal findings of blood chemistry: Secondary | ICD-10-CM

## 2017-09-13 MED ORDER — TESTOSTERONE CYPIONATE 200 MG/ML IM SOLN: 200.0000 mg | INTRAMUSCULAR | 0 refills | Status: DC

## 2017-09-13 MED ORDER — TESTOSTERONE CYPIONATE 200 MG/ML IM SOLN: 200.0000 mg | INTRAMUSCULAR | 0 refills | Status: AC

## 2017-09-13 NOTE — Telephone Encounter (Signed)
Testosterone sent to pharmacy.

## 2017-09-13 NOTE — Telephone Encounter (Signed)
Patient called requested a refill of Testosterone medication sent to [facility]  . Patient stated that he is due to come back in October to have annual done. He is requesting enough medication to get him through until then. Patient last Testosterone was done 02/22/2017 and it was 993. Arley Salamone,CMA

## 2017-09-13 NOTE — Telephone Encounter (Signed)
Patient has been informed. Rhonda Cunningham,CMA
